# Patient Record
Sex: Female | Born: 1985 | Race: White | Hispanic: No | Marital: Married | Smoking: Never smoker
Health system: Southern US, Community
[De-identification: ages and names within clinical notes are randomized; demographics above are authoritative.]

## PROBLEM LIST (undated history)

## (undated) DIAGNOSIS — N809 Endometriosis, unspecified: Secondary | ICD-10-CM

## (undated) DIAGNOSIS — E282 Polycystic ovarian syndrome: Secondary | ICD-10-CM

## (undated) DIAGNOSIS — Z8489 Family history of other specified conditions: Secondary | ICD-10-CM

## (undated) HISTORY — PX: WISDOM TOOTH EXTRACTION: SHX21

## (undated) HISTORY — PX: DILATION AND CURETTAGE OF UTERUS: SHX78

---

## 2019-03-11 DIAGNOSIS — N926 Irregular menstruation, unspecified: Secondary | ICD-10-CM | POA: Diagnosis not present

## 2019-03-11 DIAGNOSIS — L689 Hypertrichosis, unspecified: Secondary | ICD-10-CM | POA: Diagnosis not present

## 2019-03-11 DIAGNOSIS — Z6841 Body Mass Index (BMI) 40.0 and over, adult: Secondary | ICD-10-CM | POA: Diagnosis not present

## 2019-03-25 DIAGNOSIS — R7989 Other specified abnormal findings of blood chemistry: Secondary | ICD-10-CM | POA: Diagnosis not present

## 2019-03-25 DIAGNOSIS — R9389 Abnormal findings on diagnostic imaging of other specified body structures: Secondary | ICD-10-CM | POA: Diagnosis not present

## 2019-04-19 DIAGNOSIS — R9389 Abnormal findings on diagnostic imaging of other specified body structures: Secondary | ICD-10-CM | POA: Diagnosis not present

## 2019-04-19 DIAGNOSIS — E282 Polycystic ovarian syndrome: Secondary | ICD-10-CM | POA: Diagnosis not present

## 2019-04-19 DIAGNOSIS — N949 Unspecified condition associated with female genital organs and menstrual cycle: Secondary | ICD-10-CM | POA: Diagnosis not present

## 2019-04-19 DIAGNOSIS — Z3200 Encounter for pregnancy test, result unknown: Secondary | ICD-10-CM | POA: Diagnosis not present

## 2019-05-30 DIAGNOSIS — N949 Unspecified condition associated with female genital organs and menstrual cycle: Secondary | ICD-10-CM | POA: Diagnosis not present

## 2019-07-25 DIAGNOSIS — E282 Polycystic ovarian syndrome: Secondary | ICD-10-CM | POA: Diagnosis not present

## 2019-11-08 DIAGNOSIS — Z20822 Contact with and (suspected) exposure to covid-19: Secondary | ICD-10-CM | POA: Diagnosis not present

## 2019-12-17 ENCOUNTER — Emergency Department (HOSPITAL_COMMUNITY): Payer: BC Managed Care – PPO

## 2019-12-17 ENCOUNTER — Observation Stay (HOSPITAL_COMMUNITY)
Admission: EM | Admit: 2019-12-17 | Discharge: 2019-12-19 | Disposition: A | Discharge status: Home / Self Care | Payer: BC Managed Care – PPO | Attending: Surgery | Admitting: Surgery

## 2019-12-17 ENCOUNTER — Encounter (HOSPITAL_COMMUNITY): Payer: Self-pay | Admitting: Emergency Medicine

## 2019-12-17 ENCOUNTER — Other Ambulatory Visit: Payer: Self-pay

## 2019-12-17 DIAGNOSIS — K8000 Calculus of gallbladder with acute cholecystitis without obstruction: Principal | ICD-10-CM | POA: Diagnosis present

## 2019-12-17 DIAGNOSIS — Z79899 Other long term (current) drug therapy: Secondary | ICD-10-CM | POA: Diagnosis not present

## 2019-12-17 DIAGNOSIS — R1011 Right upper quadrant pain: Secondary | ICD-10-CM

## 2019-12-17 DIAGNOSIS — K8010 Calculus of gallbladder with chronic cholecystitis without obstruction: Secondary | ICD-10-CM | POA: Diagnosis not present

## 2019-12-17 DIAGNOSIS — Z20822 Contact with and (suspected) exposure to covid-19: Secondary | ICD-10-CM | POA: Diagnosis not present

## 2019-12-17 DIAGNOSIS — K810 Acute cholecystitis: Secondary | ICD-10-CM

## 2019-12-17 HISTORY — DX: Family history of other specified conditions: Z84.89

## 2019-12-17 LAB — COMPREHENSIVE METABOLIC PANEL
ALT: 37 U/L (ref 0–44)
AST: 21 U/L (ref 15–41)
Albumin: 3.9 g/dL (ref 3.5–5.0)
Alkaline Phosphatase: 74 U/L (ref 38–126)
Anion gap: 9 (ref 5–15)
BUN: 10 mg/dL (ref 6–20)
CO2: 26 mmol/L (ref 22–32)
Calcium: 9.3 mg/dL (ref 8.9–10.3)
Chloride: 102 mmol/L (ref 98–111)
Creatinine, Ser: 0.61 mg/dL (ref 0.44–1.00)
GFR calc Af Amer: >60 mL/min (ref >60)
GFR calc non Af Amer: >60 mL/min (ref >60)
Glucose, Bld: 93 mg/dL (ref 70–99)
Potassium: 4.1 mmol/L (ref 3.5–5.1)
Sodium: 137 mmol/L (ref 135–145)
Total Bilirubin: 0.4 mg/dL (ref 0.3–1.2)
Total Protein: 7.2 g/dL (ref 6.5–8.1)

## 2019-12-17 LAB — CBC WITH DIFFERENTIAL/PLATELET
Abs Immature Granulocytes: 0.03 10*3/uL (ref 0.00–0.07)
Basophils Absolute: 0 10*3/uL (ref 0.0–0.1)
Basophils Relative: 0 %
Eosinophils Absolute: 0 10*3/uL (ref 0.0–0.5)
Eosinophils Relative: 0 %
HCT: 38.9 % (ref 36.0–46.0)
Hemoglobin: 12.4 g/dL (ref 12.0–15.0)
Immature Granulocytes: 0 %
Lymphocytes Relative: 22 %
Lymphs Abs: 1.9 10*3/uL (ref 0.7–4.0)
MCH: 27.6 pg (ref 26.0–34.0)
MCHC: 31.9 g/dL (ref 30.0–36.0)
MCV: 86.6 fL (ref 80.0–100.0)
Monocytes Absolute: 0.6 10*3/uL (ref 0.1–1.0)
Monocytes Relative: 7 %
Neutro Abs: 6.3 10*3/uL (ref 1.7–7.7)
Neutrophils Relative %: 71 %
Platelets: 266 10*3/uL (ref 150–400)
RBC: 4.49 MIL/uL (ref 3.87–5.11)
RDW: 14.4 % (ref 11.5–15.5)
WBC: 9 10*3/uL (ref 4.0–10.5)
nRBC: 0 % (ref 0.0–0.2)

## 2019-12-17 LAB — LIPASE, BLOOD: Lipase: 28 U/L (ref 11–51)

## 2019-12-17 LAB — URINALYSIS, ROUTINE W REFLEX MICROSCOPIC
Bilirubin Urine: NEGATIVE
Glucose, UA: NEGATIVE mg/dL
Hgb urine dipstick: NEGATIVE
Ketones, ur: NEGATIVE mg/dL
Nitrite: NEGATIVE
Protein, ur: NEGATIVE mg/dL
Specific Gravity, Urine: 1.02 (ref 1.005–1.030)
pH: 6 (ref 5.0–8.0)

## 2019-12-17 LAB — I-STAT BETA HCG BLOOD, ED (MC, WL, AP ONLY): I-stat hCG, quantitative: <5 m[IU]/mL (ref <5)

## 2019-12-17 LAB — SARS CORONAVIRUS 2 BY RT PCR (HOSPITAL ORDER, PERFORMED IN ~~LOC~~ HOSPITAL LAB): SARS Coronavirus 2: NEGATIVE

## 2019-12-17 MED ORDER — ENOXAPARIN SODIUM 40 MG/0.4ML ~~LOC~~ SOLN
40.0000 mg | SUBCUTANEOUS | Status: DC
  Administered 2019-12-17: 40 mg via SUBCUTANEOUS
  Filled 2019-12-17: qty 0.4

## 2019-12-17 MED ORDER — SODIUM CHLORIDE 0.9 % IV SOLN
2.0000 g | INTRAVENOUS | Status: DC
  Administered 2019-12-17: 2 g via INTRAVENOUS
  Filled 2019-12-17 (×2): qty 20

## 2019-12-17 MED ORDER — POTASSIUM CHLORIDE IN NACL 20-0.9 MEQ/L-% IV SOLN
INTRAVENOUS | Status: DC
  Filled 2019-12-17 (×2): qty 1000

## 2019-12-17 MED ORDER — ONDANSETRON 4 MG PO TBDP: 4.0000 mg | ORAL_TABLET | Freq: Four times a day (QID) | ORAL | Status: DC | PRN

## 2019-12-17 MED ORDER — ACETAMINOPHEN 325 MG PO TABS
650.0000 mg | ORAL_TABLET | Freq: Four times a day (QID) | ORAL | Status: DC | PRN
  Administered 2019-12-18: 650 mg via ORAL
  Filled 2019-12-17: qty 2

## 2019-12-17 MED ORDER — MORPHINE SULFATE (PF) 2 MG/ML IV SOLN
2.0000 mg | INTRAVENOUS | Status: DC | PRN
  Administered 2019-12-18: 2 mg via INTRAVENOUS
  Filled 2019-12-17: qty 1

## 2019-12-17 MED ORDER — DIPHENHYDRAMINE HCL 50 MG/ML IJ SOLN: 25.0000 mg | Freq: Four times a day (QID) | INTRAMUSCULAR | Status: DC | PRN

## 2019-12-17 MED ORDER — SODIUM CHLORIDE 0.9 % IV BOLUS
1000.0000 mL | Freq: Once | INTRAVENOUS | Status: AC
  Administered 2019-12-17: 1000 mL via INTRAVENOUS

## 2019-12-17 MED ORDER — DIPHENHYDRAMINE HCL 25 MG PO CAPS: 25.0000 mg | ORAL_CAPSULE | Freq: Four times a day (QID) | ORAL | Status: DC | PRN

## 2019-12-17 MED ORDER — ACETAMINOPHEN 650 MG RE SUPP: 650.0000 mg | Freq: Four times a day (QID) | RECTAL | Status: DC | PRN

## 2019-12-17 MED ORDER — ONDANSETRON HCL 4 MG/2ML IJ SOLN
4.0000 mg | Freq: Once | INTRAMUSCULAR | Status: AC
  Administered 2019-12-17: 4 mg via INTRAVENOUS
  Filled 2019-12-17: qty 2

## 2019-12-17 MED ORDER — ONDANSETRON HCL 4 MG/2ML IJ SOLN: 4.0000 mg | Freq: Four times a day (QID) | INTRAMUSCULAR | Status: DC | PRN

## 2019-12-17 NOTE — ED Triage Notes (Signed)
Patient with right flank pain that started last night around 11pm.  Patient states that it has gotten worse, making her nauseated and she has vomited this evening.  Pain radiates to her back.  Patient denies any urinary symptoms at this time.  No history of kidney stones.

## 2019-12-17 NOTE — ED Provider Notes (Signed)
[facility] EMERGENCY DEPARTMENT Provider Note   CSN: [id number] Arrival date & time: 12/17/19  0324     History Chief Complaint  Patient presents with  . Flank Pain  . Emesis    Eileen Todd is a 34 y.o. female who presents to the ED today with complaint of sudden onset, constant, sharp, RUQ/right flank pain that began last night around 09:30 PM. Pt reports she had eaten a burger and a buffalo chicken salad and approximately 4 hours later began having the pain. Pt also complains of nausea and ~ 4 episodes of NBNB emesis. She came to the ED as the pain was severe however since being in the waiting room the pain has subsided. She reports it was about a 9/10 on arrival and is currently a 1/10 without intervention. Pt is still nauseated however has not vomited in several hours; has not tried to eat anything yet. She denies any previous similar symptoms. Denies fevers, chills, diarrhea, constipation, urinary symptoms, pelvic pain, vaginal discharge, or any other associated symptoms. No previous abdominal surgeries. No hx of kidney stones.   The history is provided by the patient and medical records.       History reviewed. No pertinent past medical history.  There are no problems to display for this patient.   History reviewed. No pertinent surgical history.   OB History   No obstetric history on file.     No family history on file.  Social History   Tobacco Use  . Smoking status: Never Smoker  . Smokeless tobacco: Never Used  Substance Use Topics  . Alcohol use: Not on file  . Drug use: Not on file    Home Medications Prior to Admission medications   Medication Sig Start Date End Date Taking? Authorizing Provider  metFORMIN (GLUCOPHAGE) 500 MG tablet Take 500 mg by mouth 3 (three) times daily. 11/14/19  Yes [provider]  SLYND 4 MG TABS Take 1 tablet by mouth daily. 11/25/19  Yes [provider]    Allergies    Patient has no  known allergies.  Review of Systems   Review of Systems  Constitutional: Negative for chills and fever.  Respiratory: Negative for shortness of breath.   Cardiovascular: Negative for chest pain.  Gastrointestinal: Positive for abdominal pain, nausea and vomiting. Negative for constipation and diarrhea.  Genitourinary: Positive for flank pain. Negative for difficulty urinating, dysuria, hematuria and menstrual problem.  All other systems reviewed and are negative.   Physical Exam Updated Vital Signs BP 119/66   Pulse 84   Temp 98.2 F (36.8 C) (Oral)   Resp (!) 22   Ht 5\' 7"  (1.702 m)   Wt 124.7 kg   SpO2 98%   BMI 43.07 kg/m   Physical Exam Vitals and nursing note reviewed.  Constitutional:      Appearance: She is obese. She is not ill-appearing or diaphoretic.  HENT:     Head: Normocephalic and atraumatic.     Mouth/Throat:     Mouth: Mucous membranes are dry.  Eyes:     Conjunctiva/sclera: Conjunctivae normal.  Cardiovascular:     Rate and Rhythm: Normal rate and regular rhythm.     Pulses: Normal pulses.  Pulmonary:     Effort: Pulmonary effort is normal.     Breath sounds: Normal breath sounds. No wheezing, rhonchi or rales.  Abdominal:     Palpations: Abdomen is soft.     Tenderness: There is abdominal tenderness. There  is no right CVA tenderness, left CVA tenderness, guarding or rebound. Positive signs include Murphy's sign.  Musculoskeletal:     Cervical back: Neck supple.  Skin:    General: Skin is warm and dry.  Neurological:     Mental Status: She is alert.     ED Results / Procedures / Treatments   Labs (all labs ordered are listed, but only abnormal results are displayed) Labs Reviewed  URINALYSIS, ROUTINE W REFLEX MICROSCOPIC - Abnormal; Notable for the following components:      Result Value   APPearance HAZY (*)    Leukocytes,Ua TRACE (*)    Bacteria, UA RARE (*)    All other components within normal limits  SARS CORONAVIRUS 2 BY RT PCR  (HOSPITAL ORDER, [facility])  COMPREHENSIVE METABOLIC PANEL  LIPASE, BLOOD  CBC WITH DIFFERENTIAL/PLATELET  I-STAT BETA HCG BLOOD, ED ([facility], [facility], [facility] ONLY)    EKG None  Radiology CT Renal Stone Study  Result Date: 12/17/2019 CLINICAL DATA:  Per ed notes: Patient with right flank pain that started last night around 11pm. Patient states that it has gotten worse, making her nauseated and she has vomited this evening. Pain radiates to her backFlank pain, kidney stone suspected EXAM: CT ABDOMEN AND PELVIS WITHOUT CONTRAST TECHNIQUE: Multidetector CT imaging of the abdomen and pelvis was performed following the standard protocol without IV contrast. COMPARISON:  None. FINDINGS: Lower chest: Lung bases are clear. Hepatobiliary: No focal hepatic lesion. Low-density within the liver parenchyma. Large 2 cm gallstone without evidence of gallbladder inflammation. Pancreas: Pancreas is normal. No ductal dilatation. No pancreatic inflammation. Spleen: Normal spleen Adrenals/urinary tract: RIGHT adrenal gland normal. Round lesion adjacent to the LEFT adrenal gland measuring 1.8 cm has low density consistent with a adrenal adenoma. No nephrolithiasis or ureterolithiasis. No obstructive uropathy. No bladder calculi. Stomach/Bowel: Stomach, small-bowel and cecum are normal. The appendix is not identified but there is no pericecal inflammation to suggest appendicitis. The colon and rectosigmoid colon are normal. Vascular/Lymphatic: Abdominal aorta is normal caliber. No periportal or retroperitoneal adenopathy. No pelvic adenopathy. Reproductive: Lobular uterus suggest underlying leiomyoma. Ovaries normal. Other: No free fluid. Musculoskeletal: No aggressive osseous lesion. IMPRESSION: 1. No nephrolithiasis, ureterolithiasis or obstructive uropathy. 2. Hepatic steatosis. 3. Cholelithiasis without evidence cholecystitis. 4. LEFT adrenal adenoma. 5. leiomyomatous uterus. Electronically Signed   By:  Suzy Bouchard M.D.   On: 12/17/2019 05:43   US Abdomen Limited RUQ  Result Date: 12/17/2019 CLINICAL DATA:  Upper abdominal pain with vomiting EXAM: ULTRASOUND ABDOMEN LIMITED RIGHT UPPER QUADRANT COMPARISON:  None. FINDINGS: Gallbladder: Within the gallbladder, there is a 2.7 cm echogenic focus which moves and shadows consistent with either one large or multiple adherent smaller gallstones. The gallbladder wall is thickened and edematous with mild pericholecystic fluid. No sonographic Murphy sign noted by sonographer. Common bile duct: Diameter: 4 mm. No intrahepatic or extrahepatic biliary duct dilatation. Liver: No focal lesion identified. Liver echogenicity overall is increased. Portal vein is patent on color Doppler imaging with normal direction of blood flow towards the liver. Other: None. IMPRESSION: 1. Cholelithiasis with thickened, edematous gallbladder wall and pericholecystic fluid. These findings are indicative of a degree of acute cholecystitis. 2. Increase in liver echogenicity, a finding indicative of hepatic steatosis. No focal liver lesions evident. Electronically Signed   By: Lowella Grip III M.D.   On: 12/17/2019 12:10    Procedures Procedures (including critical care time)  Medications Ordered in ED Medications  sodium chloride 0.9 %  bolus 1,000 mL (0 mLs Intravenous Stopped 12/17/19 1100)  ondansetron (ZOFRAN) injection 4 mg (4 mg Intravenous Given 12/17/19 0957)    ED Course  I have reviewed the triage vital signs and the nursing notes.  Pertinent labs & imaging results that were available during my care of the patient were reviewed by me and considered in my medical decision making (see chart for details).  Clinical Course as of Dec 16 1417  Sat Dec 16, 1368  [id number] 34 year old female with a history of obesity present emergency department right upper quadrant and right flank pain with onset last night.  She family history of biliary disease.  Here she does have a  positive Murphy sign with significant tenderness in the right upper quadrant.  Her CT scan and subsequent ultrasound showed signs of edema or inflammation around the gallbladder.  Patient was evaluated by the general surgeon who decided to admit her to their service for suspected biliary disease.  She is otherwise well-appearing.  She reports her pain had diminished to 0 out of 10, but she does still continue have tenderness on exam.  She has no nausea currently.  Her labs and work-up were not suggestive of sepsis.  Patient updated about likely diagnosis.  Management per surgery team.   [MT]    Clinical Course User Index [MT] Wyvonnia Dusky, MD   MDM Rules/Calculators/A&P                          34 year old female who presents to the ED today with complaint of right upper quadrant/right flank pain that started last night at 9:30 PM with associated nausea and vomiting.  She presented to the ED several hours ago and sat in the waiting room.  She had a urinalysis obtained which does show trace leuks and rare bacteria however 0-5 white blood cells per high-power field, 0-5 red blood cells, no hemoglobin on dipstick.  Beta-hCG is negative.  She has no history of stones however a CT renal stone study was obtained while patient was in the waiting room, no findings of nephrolithiasis.  Incidentally CT scan does show cholelithiasis without evidence of cholecystitis.  Patient is brought back to the room she has obvious positive Murphy sign on exam.  She reports the symptoms began 4 hours after eating a burger and a buffalo chicken salad.  Suspect this is the trigger.  Will plan for lab work including CBC, CMP, lipase.  If any abnormalities will likely obtain right upper quadrant ultrasound however I have low suspicion for acute cholecystitis today given patient is overall well-appearing, reports her pain is subsided, on arrival she was afebrile, nontachycardic and nontachypneic.  She denies any fevers or chills  at home.  Will likely plan for discharge and outpatient follow-up with general surgery.   CBC without leukocytosis. Hgb stable at 12.4.  CMP without acute abnormalities. LFTs all unremarkable.  Lipase 28.   Labwork all reassuring however given pt still having TTP to RUQ will plan for ultrasound at this time for further assessment.   RUQ ultrasound IMPRESSION:  1. Cholelithiasis with thickened, edematous gallbladder wall and  pericholecystic fluid. These findings are indicative of a degree of  acute cholecystitis.    2. Increase in liver echogenicity, a finding indicative of hepatic  steatosis. No focal liver lesions evident.   Given degree of acute cholecystitis on ultrasound will plan to discuss with general surgery team and ask them to come evaluate patient  at bedside. Pt overall well appearing at this time. Last ate or drank at 5 PM last night.   Discussed case with general surgeon Dr. Georgette Dover; will come evaluate patient, admit, and plan for OR in the morning. COVID test ordered.   This note was prepared using Dragon voice recognition software and may include unintentional dictation errors due to the inherent limitations of voice recognition software.  Final Clinical Impression(s) / ED Diagnoses Final diagnoses:  RUQ abdominal pain  Acute cholecystitis    Rx / DC Orders ED Discharge Orders    None       Eustaquio Maize, PA-C 12/17/19 1419    Wyvonnia Dusky, MD 12/17/19 1719

## 2019-12-17 NOTE — H&P (Signed)
Eileen Todd is an 34 y.o. female.   Chief Complaint: RUQ pain/ nausea/ vomiting HPI: This is a 34 year old female who is prediabetic who presents with acute onset of RUQ pain last night after eating a buffalo chicken salad and a burger.  She has had no previous similar episodes.  She reports that the pain radiated around her right side to her flank.  She also several episodes of nausea and vomiting, in addition to abdominal bloating.  While waiting in the ED waiting area, the pain subsides, but she remains quite tender to palpation and with the [place].  PMH - Prediabetic - on Metformin  PSH - D&C, wisdom teeth  FH: - Malignant hyperthermia in her paternal grandfather  Social History:  reports that she has never smoked. She has never used smokeless tobacco. No history on file for alcohol use and drug use.  Allergies: No Known Allergies  Prior to Admission medications   Medication Sig Start Date End Date Taking? Authorizing Provider  metFORMIN (GLUCOPHAGE) 500 MG tablet Take 500 mg by mouth 3 (three) times daily. 11/14/19  Yes [provider]  SLYND 4 MG TABS Take 1 tablet by mouth daily. 11/25/19  Yes [provider]     Results for orders placed or performed during the hospital encounter of 12/17/19 (from the past 48 hour(s))  Urinalysis, Routine w reflex microscopic Urine, Clean Catch     Status: Abnormal   Collection Time: 12/17/19  3:35 AM  Result Value Ref Range   Color, Urine YELLOW YELLOW   APPearance HAZY (A) CLEAR   Specific Gravity, Urine 1.020 1.005 - 1.030   pH 6.0 5.0 - 8.0   Glucose, UA NEGATIVE NEGATIVE mg/dL   Hgb urine dipstick NEGATIVE NEGATIVE   Bilirubin Urine NEGATIVE NEGATIVE   Ketones, ur NEGATIVE NEGATIVE mg/dL   Protein, ur NEGATIVE NEGATIVE mg/dL   Nitrite NEGATIVE NEGATIVE   Leukocytes,Ua TRACE (A) NEGATIVE   RBC / HPF 0-5 0 - 5 RBC/hpf   WBC, UA 0-5 0 - 5 WBC/hpf   Bacteria, UA RARE (A) NONE SEEN   Squamous Epithelial / LPF 0-5 0 - 5     Comment: Performed at [facility], [address], [place], [place] [address]  I-Stat beta hCG blood, ED     Status: None   Collection Time: 12/17/19  4:06 AM  Result Value Ref Range   I-stat hCG, quantitative <5.0 <5 mIU/mL   Comment 3            Comment:   GEST. AGE      CONC.  (mIU/mL)   <=1 WEEK        5 - 50     2 WEEKS       50 - 500     3 WEEKS       100 - 10,000     4 WEEKS     1,000 - 30,000        FEMALE AND NON-PREGNANT FEMALE:     LESS THAN 5 mIU/mL   Comprehensive metabolic panel     Status: None   Collection Time: 12/17/19 10:06 AM  Result Value Ref Range   Sodium 137 135 - 145 mmol/L   Potassium 4.1 3.5 - 5.1 mmol/L   Chloride 102 98 - 111 mmol/L   CO2 26 22 - 32 mmol/L   Glucose, Bld 93 70 - 99 mg/dL    Comment: Glucose reference range applies only to samples  taken after fasting for at least 8 hours.   BUN 10 6 - 20 mg/dL   Creatinine, Ser 0.61 0.44 - 1.00 mg/dL   Calcium 9.3 8.9 - 10.3 mg/dL   Total Protein 7.2 6.5 - 8.1 g/dL   Albumin 3.9 3.5 - 5.0 g/dL   AST 21 15 - 41 U/L   ALT 37 0 - 44 U/L   Alkaline Phosphatase 74 38 - 126 U/L   Total Bilirubin 0.4 0.3 - 1.2 mg/dL   GFR calc non Af Amer >60 >60 mL/min   GFR calc Af Amer >60 >60 mL/min   Anion gap 9 5 - 15    Comment: Performed at [facility], [place] [address], [place], [place] [address]  Lipase, blood     Status: None   Collection Time: 12/17/19 10:06 AM  Result Value Ref Range   Lipase 28 11 - 51 U/L    Comment: Performed at [facility] [address] [place], [place] [address]  CBC with Differential     Status: None   Collection Time: 12/17/19 10:06 AM  Result Value Ref Range   WBC 9.0 4.0 - 10.5 K/uL   RBC 4.49 3.87 - 5.11 MIL/uL   Hemoglobin 12.4 12.0 - 15.0 g/dL   HCT 38.9 36 - 46 %   MCV 86.6 80.0 - 100.0 fL   MCH 27.6 26.0 - 34.0 pg   MCHC 31.9 30.0 - 36.0 g/dL   RDW 14.4 11.5 - 15.5 %   Platelets 266 150 - 400 K/uL   nRBC 0.0 0.0 - 0.2 %   Neutrophils Relative %  71 %   Neutro Abs 6.3 1.7 - 7.7 K/uL   Lymphocytes Relative 22 %   Lymphs Abs 1.9 0.7 - 4.0 K/uL   Monocytes Relative 7 %   Monocytes Absolute 0.6 0 - 1 K/uL   Eosinophils Relative 0 %   Eosinophils Absolute 0.0 0 - 0 K/uL   Basophils Relative 0 %   Basophils Absolute 0.0 0 - 0 K/uL   Immature Granulocytes 0 %   Abs Immature Granulocytes 0.03 0.00 - 0.07 K/uL    Comment: Performed at [facility], [address], [place], [place] [address]   CT Renal Stone Study  Result Date: 12/17/2019 CLINICAL DATA:  Per ed notes: Patient with right flank pain that started last night around 11pm. Patient states that it has gotten worse, making her nauseated and she has vomited this evening. Pain radiates to her backFlank pain, kidney stone suspected EXAM: CT ABDOMEN AND PELVIS WITHOUT CONTRAST TECHNIQUE: Multidetector CT imaging of the abdomen and pelvis was performed following the standard protocol without IV contrast. COMPARISON:  None. FINDINGS: Lower chest: Lung bases are clear. Hepatobiliary: No focal hepatic lesion. Low-density within the liver parenchyma. Large 2 cm gallstone without evidence of gallbladder inflammation. Pancreas: Pancreas is normal. No ductal dilatation. No pancreatic inflammation. Spleen: Normal spleen Adrenals/urinary tract: RIGHT adrenal gland normal. Round lesion adjacent to the LEFT adrenal gland measuring 1.8 cm has low density consistent with a adrenal adenoma. No nephrolithiasis or ureterolithiasis. No obstructive uropathy. No bladder calculi. Stomach/Bowel: Stomach, small-bowel and cecum are normal. The appendix is not identified but there is no pericecal inflammation to suggest appendicitis. The colon and rectosigmoid colon are normal. Vascular/Lymphatic: Abdominal aorta is normal caliber. No periportal or retroperitoneal adenopathy. No pelvic adenopathy. Reproductive: Lobular uterus suggest underlying leiomyoma. Ovaries normal. Other: No free fluid. Musculoskeletal: No  aggressive osseous lesion.  IMPRESSION: 1. No nephrolithiasis, ureterolithiasis or obstructive uropathy. 2. Hepatic steatosis. 3. Cholelithiasis without evidence cholecystitis. 4. LEFT adrenal adenoma. 5. leiomyomatous uterus. Electronically Signed   By: Suzy Bouchard M.D.   On: 12/17/2019 05:43   US Abdomen Limited RUQ  Result Date: 12/17/2019 CLINICAL DATA:  Upper abdominal pain with vomiting EXAM: ULTRASOUND ABDOMEN LIMITED RIGHT UPPER QUADRANT COMPARISON:  None. FINDINGS: Gallbladder: Within the gallbladder, there is a 2.7 cm echogenic focus which moves and shadows consistent with either one large or multiple adherent smaller gallstones. The gallbladder wall is thickened and edematous with mild pericholecystic fluid. No sonographic Murphy sign noted by sonographer. Common bile duct: Diameter: 4 mm. No intrahepatic or extrahepatic biliary duct dilatation. Liver: No focal lesion identified. Liver echogenicity overall is increased. Portal vein is patent on color Doppler imaging with normal direction of blood flow towards the liver. Other: None. IMPRESSION: 1. Cholelithiasis with thickened, edematous gallbladder wall and pericholecystic fluid. These findings are indicative of a degree of acute cholecystitis. 2. Increase in liver echogenicity, a finding indicative of hepatic steatosis. No focal liver lesions evident. Electronically Signed   By: Lowella Grip III M.D.   On: 12/17/2019 12:10    Review of Systems  HENT: Negative for ear discharge, ear pain, hearing loss and tinnitus.   Eyes: Negative for photophobia and pain.  Respiratory: Negative for cough and shortness of breath.   Cardiovascular: Negative for chest pain.  Gastrointestinal: Positive for abdominal pain, nausea and vomiting.  Genitourinary: Positive for flank pain. Negative for dysuria, frequency and urgency.  Musculoskeletal: Negative for back pain, myalgias and neck pain.  Neurological: Negative for dizziness and headaches.   Hematological: Does not bruise/bleed easily.  Psychiatric/Behavioral: The patient is not nervous/anxious.     Blood pressure (!) 120/59, pulse 75, temperature 98.2 F (36.8 C), temperature source Oral, resp. rate (!) 21, height 5\' 7"  (1.702 m), weight 124.7 kg, SpO2 96 %. Physical Exam  Constitutional:  WDWN in NAD, conversant, no obvious deformities; lying in bed comfortably Eyes:  Pupils equal, round; sclera anicteric; moist conjunctiva; no lid lag HENT:  Oral mucosa moist; good dentition  Neck:  No masses palpated, trachea midline; no thyromegaly Lungs:  CTA bilaterally; normal respiratory effort CV:  Regular rate and rhythm; no murmurs; extremities well-perfused with no edema Abd:  +bowel sounds, obese, tender in RUQ Musc:  Unable to assess gait; no apparent clubbing or cyanosis in extremities Lymphatic:  No palpable cervical or axillary lymphadenopathy Skin:  Warm, dry; no sign of jaundice Psychiatric - alert and oriented x 4; calm mood and affect  Assessment/Plan Acute calculus cholecystitis  Admit for IV antibiotics Plan laparoscopic cholecystectomy by Dr. Kieth Brightly tomorrow.  The surgical procedure has been discussed with the patient.  Potential risks, benefits, alternative treatments, and expected outcomes have been explained.  All of the patient's questions at this time have been answered.  The likelihood of reaching the patient's treatment goal is good.  The patient understand the proposed surgical procedure and wishes to proceed.   Maia Petties, MD 12/17/2019, 1:28 PM

## 2019-12-18 ENCOUNTER — Encounter (HOSPITAL_COMMUNITY): Payer: Self-pay

## 2019-12-18 ENCOUNTER — Observation Stay (HOSPITAL_COMMUNITY): Payer: BC Managed Care – PPO | Admitting: Certified Registered Nurse Anesthetist

## 2019-12-18 ENCOUNTER — Encounter (HOSPITAL_COMMUNITY)
Admission: EM | Disposition: A | Discharge status: Home / Self Care | Payer: Self-pay | Source: Home / Self Care | Attending: Emergency Medicine

## 2019-12-18 DIAGNOSIS — K8000 Calculus of gallbladder with acute cholecystitis without obstruction: Secondary | ICD-10-CM | POA: Diagnosis not present

## 2019-12-18 DIAGNOSIS — K8010 Calculus of gallbladder with chronic cholecystitis without obstruction: Secondary | ICD-10-CM | POA: Diagnosis not present

## 2019-12-18 DIAGNOSIS — E119 Type 2 diabetes mellitus without complications: Secondary | ICD-10-CM | POA: Diagnosis not present

## 2019-12-18 HISTORY — PX: CHOLECYSTECTOMY: SHX55

## 2019-12-18 LAB — GLUCOSE, CAPILLARY: Glucose-Capillary: 89 mg/dL (ref 70–99)

## 2019-12-18 LAB — HIV ANTIBODY (ROUTINE TESTING W REFLEX): HIV Screen 4th Generation wRfx: NONREACTIVE

## 2019-12-18 LAB — SURGICAL PCR SCREEN
MRSA, PCR: NEGATIVE
Staphylococcus aureus: NEGATIVE

## 2019-12-18 SURGERY — LAPAROSCOPIC CHOLECYSTECTOMY WITH INTRAOPERATIVE CHOLANGIOGRAM
Anesthesia: General | Site: Abdomen

## 2019-12-18 MED ORDER — BUPIVACAINE HCL (PF) 0.25 % IJ SOLN
INTRAMUSCULAR | Status: AC
  Filled 2019-12-18: qty 30

## 2019-12-18 MED ORDER — CHLORHEXIDINE GLUCONATE 0.12 % MT SOLN
OROMUCOSAL | Status: AC
  Administered 2019-12-18: 15 mL
  Filled 2019-12-18: qty 15

## 2019-12-18 MED ORDER — OXYCODONE HCL 5 MG PO TABS
5.0000 mg | ORAL_TABLET | Freq: Four times a day (QID) | ORAL | Status: DC | PRN
  Filled 2019-12-18: qty 1

## 2019-12-18 MED ORDER — FENTANYL CITRATE (PF) 100 MCG/2ML IJ SOLN
25.0000 ug | INTRAMUSCULAR | Status: DC | PRN
  Administered 2019-12-18: 50 ug via INTRAVENOUS

## 2019-12-18 MED ORDER — BUPIVACAINE HCL 0.25 % IJ SOLN
INTRAMUSCULAR | Status: DC | PRN
  Administered 2019-12-18: 26 mL

## 2019-12-18 MED ORDER — ENSURE PRE-SURGERY PO LIQD
296.0000 mL | Freq: Once | ORAL | Status: AC
  Administered 2019-12-18: 296 mL via ORAL
  Filled 2019-12-18: qty 296

## 2019-12-18 MED ORDER — FENTANYL CITRATE (PF) 250 MCG/5ML IJ SOLN
INTRAMUSCULAR | Status: AC
  Filled 2019-12-18: qty 5

## 2019-12-18 MED ORDER — CEFAZOLIN SODIUM-DEXTROSE 2-4 GM/100ML-% IV SOLN
INTRAVENOUS | Status: AC
  Filled 2019-12-18: qty 100

## 2019-12-18 MED ORDER — ROCURONIUM BROMIDE 10 MG/ML (PF) SYRINGE
PREFILLED_SYRINGE | INTRAVENOUS | Status: DC | PRN
  Administered 2019-12-18: 50 mg via INTRAVENOUS
  Administered 2019-12-18 (×2): 20 mg via INTRAVENOUS

## 2019-12-18 MED ORDER — GABAPENTIN 300 MG PO CAPS
300.0000 mg | ORAL_CAPSULE | ORAL | Status: AC
  Administered 2019-12-18: 300 mg via ORAL
  Filled 2019-12-18: qty 1

## 2019-12-18 MED ORDER — SODIUM CHLORIDE 0.9 % IR SOLN
Status: DC | PRN
  Administered 2019-12-18: 1000 mL

## 2019-12-18 MED ORDER — PROPOFOL 10 MG/ML IV BOLUS
INTRAVENOUS | Status: AC
  Filled 2019-12-18: qty 20

## 2019-12-18 MED ORDER — KETOROLAC TROMETHAMINE 15 MG/ML IJ SOLN
15.0000 mg | Freq: Three times a day (TID) | INTRAMUSCULAR | Status: DC
  Administered 2019-12-18 – 2019-12-19 (×2): 15 mg via INTRAVENOUS
  Filled 2019-12-18 (×2): qty 1

## 2019-12-18 MED ORDER — GABAPENTIN 300 MG PO CAPS
ORAL_CAPSULE | ORAL | Status: AC
  Filled 2019-12-18: qty 1

## 2019-12-18 MED ORDER — DEXAMETHASONE SODIUM PHOSPHATE 10 MG/ML IJ SOLN
INTRAMUSCULAR | Status: DC | PRN
  Administered 2019-12-18: 10 mg via INTRAVENOUS

## 2019-12-18 MED ORDER — 0.9 % SODIUM CHLORIDE (POUR BTL) OPTIME
TOPICAL | Status: DC | PRN
  Administered 2019-12-18: 1000 mL

## 2019-12-18 MED ORDER — LIDOCAINE 2% (20 MG/ML) 5 ML SYRINGE
INTRAMUSCULAR | Status: DC | PRN
  Administered 2019-12-18: 60 mg via INTRAVENOUS

## 2019-12-18 MED ORDER — WHITE PETROLATUM EX OINT
TOPICAL_OINTMENT | CUTANEOUS | Status: AC
  Filled 2019-12-18: qty 28.35

## 2019-12-18 MED ORDER — PROPOFOL 10 MG/ML IV BOLUS
INTRAVENOUS | Status: DC | PRN
  Administered 2019-12-18: 120 mg via INTRAVENOUS

## 2019-12-18 MED ORDER — OXYCODONE HCL 5 MG PO TABS: 5.0000 mg | ORAL_TABLET | Freq: Four times a day (QID) | ORAL | 0 refills | Status: DC | PRN

## 2019-12-18 MED ORDER — PROPOFOL 500 MG/50ML IV EMUL
INTRAVENOUS | Status: DC | PRN
  Administered 2019-12-18: 150 ug/kg/min via INTRAVENOUS
  Administered 2019-12-18: 100 ug/kg/min via INTRAVENOUS
  Administered 2019-12-18: 150 ug/kg/min via INTRAVENOUS

## 2019-12-18 MED ORDER — FENTANYL CITRATE (PF) 100 MCG/2ML IJ SOLN
INTRAMUSCULAR | Status: DC | PRN
Start: 2019-12-18 — End: 2019-12-18
  Administered 2019-12-18 (×3): 50 ug via INTRAVENOUS
  Administered 2019-12-18: 100 ug via INTRAVENOUS

## 2019-12-18 MED ORDER — ACETAMINOPHEN 500 MG PO TABS
1000.0000 mg | ORAL_TABLET | ORAL | Status: AC
  Administered 2019-12-18: 1000 mg via ORAL
  Filled 2019-12-18: qty 2

## 2019-12-18 MED ORDER — AMISULPRIDE (ANTIEMETIC) 5 MG/2ML IV SOLN: 10.0000 mg | Freq: Once | INTRAVENOUS | Status: DC | PRN

## 2019-12-18 MED ORDER — IBUPROFEN 800 MG PO TABS: 800.0000 mg | ORAL_TABLET | Freq: Three times a day (TID) | ORAL | 0 refills | Status: DC | PRN

## 2019-12-18 MED ORDER — DROSPIRENONE 4 MG PO TABS: 1.0000 | ORAL_TABLET | Freq: Every day | ORAL | Status: DC

## 2019-12-18 MED ORDER — FENTANYL CITRATE (PF) 100 MCG/2ML IJ SOLN
INTRAMUSCULAR | Status: AC
Start: 2019-12-18 — End: 2019-12-19
  Filled 2019-12-18: qty 2

## 2019-12-18 MED ORDER — LACTATED RINGERS IV SOLN: INTRAVENOUS | Status: DC

## 2019-12-18 MED ORDER — ACETAMINOPHEN 500 MG PO TABS
ORAL_TABLET | ORAL | Status: AC
  Administered 2019-12-18: 1000 mg
  Filled 2019-12-18: qty 2

## 2019-12-18 MED ORDER — ONDANSETRON HCL 4 MG/2ML IJ SOLN
INTRAMUSCULAR | Status: DC | PRN
  Administered 2019-12-18: 4 mg via INTRAVENOUS

## 2019-12-18 MED ORDER — MIDAZOLAM HCL 5 MG/5ML IJ SOLN
INTRAMUSCULAR | Status: DC | PRN
  Administered 2019-12-18: 2 mg via INTRAVENOUS

## 2019-12-18 MED ORDER — SUGAMMADEX SODIUM 200 MG/2ML IV SOLN
INTRAVENOUS | Status: DC | PRN
  Administered 2019-12-18: 400 mg via INTRAVENOUS

## 2019-12-18 MED ORDER — MIDAZOLAM HCL 2 MG/2ML IJ SOLN
INTRAMUSCULAR | Status: AC
  Filled 2019-12-18: qty 2

## 2019-12-18 SURGICAL SUPPLY — 41 items
APPLIER CLIP ROT 10 11.4 M/L (STAPLE) ×3
BLADE CLIPPER SURG (BLADE) ×3 IMPLANT
CANISTER SUCT 3000ML PPV (MISCELLANEOUS) ×3 IMPLANT
CATH CHOLANG 76X19 KUMAR (CATHETERS) IMPLANT
CHLORAPREP W/TINT 26 (MISCELLANEOUS) ×3 IMPLANT
CLIP APPLIE ROT 10 11.4 M/L (STAPLE) ×1 IMPLANT
CLIP VESOLOCK MED LG 6/CT (CLIP) IMPLANT
COVER MAYO STAND STRL (DRAPES) IMPLANT
COVER SURGICAL LIGHT HANDLE (MISCELLANEOUS) ×3 IMPLANT
COVER WAND RF STERILE (DRAPES) ×3 IMPLANT
DERMABOND ADVANCED (GAUZE/BANDAGES/DRESSINGS) ×2
DERMABOND ADVANCED .7 DNX12 (GAUZE/BANDAGES/DRESSINGS) ×1 IMPLANT
DRAPE C-ARM 42X120 X-RAY (DRAPES) IMPLANT
ELECT REM PT RETURN 9FT ADLT (ELECTROSURGICAL) ×3
ELECTRODE REM PT RTRN 9FT ADLT (ELECTROSURGICAL) ×1 IMPLANT
GLOVE BIOGEL PI IND STRL 7.0 (GLOVE) ×1 IMPLANT
GLOVE BIOGEL PI INDICATOR 7.0 (GLOVE) ×2
GLOVE SURG SS PI 7.0 STRL IVOR (GLOVE) ×3 IMPLANT
GOWN STRL REUS W/ TWL LRG LVL3 (GOWN DISPOSABLE) ×3 IMPLANT
GOWN STRL REUS W/TWL LRG LVL3 (GOWN DISPOSABLE) ×6
GRASPER SUT TROCAR 14GX15 (MISCELLANEOUS) ×3 IMPLANT
KIT BASIN OR (CUSTOM PROCEDURE TRAY) ×3 IMPLANT
KIT TURNOVER KIT B (KITS) ×3 IMPLANT
NEEDLE 22X1 1/2 (OR ONLY) (NEEDLE) ×3 IMPLANT
NS IRRIG 1000ML POUR BTL (IV SOLUTION) ×3 IMPLANT
PAD ARMBOARD 7.5X6 YLW CONV (MISCELLANEOUS) ×3 IMPLANT
POUCH RETRIEVAL ECOSAC 10 (ENDOMECHANICALS) ×1 IMPLANT
POUCH RETRIEVAL ECOSAC 10MM (ENDOMECHANICALS) ×2
SCISSORS LAP 5X35 DISP (ENDOMECHANICALS) ×3 IMPLANT
SET IRRIG TUBING LAPAROSCOPIC (IRRIGATION / IRRIGATOR) ×3 IMPLANT
SET TUBE SMOKE EVAC HIGH FLOW (TUBING) ×3 IMPLANT
SLEEVE ENDOPATH XCEL 5M (ENDOMECHANICALS) ×6 IMPLANT
SPECIMEN JAR SMALL (MISCELLANEOUS) ×3 IMPLANT
STOPCOCK 4 WAY LG BORE MALE ST (IV SETS) IMPLANT
SUT MNCRL AB 4-0 PS2 18 (SUTURE) ×3 IMPLANT
TOWEL GREEN STERILE (TOWEL DISPOSABLE) ×3 IMPLANT
TOWEL GREEN STERILE FF (TOWEL DISPOSABLE) ×3 IMPLANT
TRAY LAPAROSCOPIC MC (CUSTOM PROCEDURE TRAY) ×3 IMPLANT
TROCAR XCEL 12X100 BLDLESS (ENDOMECHANICALS) ×3 IMPLANT
TROCAR XCEL NON-BLD 5MMX100MML (ENDOMECHANICALS) ×3 IMPLANT
WATER STERILE IRR 1000ML POUR (IV SOLUTION) ×3 IMPLANT

## 2019-12-18 NOTE — Discharge Instructions (Signed)
[facility] LAPAROSCOPIC SURGERY: POST OP INSTRUCTIONS Always review your discharge instruction sheet given to you by the facility where your surgery was performed. IF YOU HAVE DISABILITY OR FAMILY LEAVE FORMS, YOU MUST BRING THEM TO THE OFFICE FOR PROCESSING.   DO NOT GIVE THEM TO YOUR DOCTOR.  PAIN CONTROL  1. First take acetaminophen (Tylenol) AND/or ibuprofen (Advil) to control your pain after surgery.  Follow directions on package.  Taking acetaminophen (Tylenol) and/or ibuprofen (Advil) regularly after surgery will help to control your pain and lower the amount of prescription pain medication you may need.  You should not take more than 3,000 mg (3 grams) of acetaminophen (Tylenol) in 24 hours.  You should not take ibuprofen (Advil), aleve, motrin, naprosyn or other NSAIDS if you have a history of stomach ulcers or chronic kidney disease.  2. A prescription for pain medication may be given to you upon discharge.  Take your pain medication as prescribed, if you still have uncontrolled pain after taking acetaminophen (Tylenol) or ibuprofen (Advil). 3. Use ice packs to help control pain. 4. If you need a refill on your pain medication, please contact your pharmacy.  They will contact our office to request authorization. Prescriptions will not be filled after 5pm or on week-ends.  HOME MEDICATIONS 5. Take your usually prescribed medications unless otherwise directed.  DIET 6. You should follow a light diet the first few days after arrival home.  Be sure to include lots of fluids daily. Avoid fatty, fried foods.   CONSTIPATION 7. It is common to experience some constipation after surgery and if you are taking pain medication.  Increasing fluid intake and taking a stool softener (such as Colace) will usually help or prevent this problem from occurring.  A mild laxative (Milk of Magnesia or Miralax) should be taken according to package instructions if there are no bowel  movements after 48 hours.  WOUND/INCISION CARE 8. Most patients will experience some swelling and bruising in the area of the incisions.  Ice packs will help.  Swelling and bruising can take several days to resolve.  9. Unless discharge instructions indicate otherwise, follow guidelines below  a. STERI-STRIPS - you may remove your outer bandages 48 hours after surgery, and you may shower at that time.  You have steri-strips (small skin tapes) in place directly over the incision.  These strips should be left on the skin for 7-10 days.   b. DERMABOND/SKIN GLUE - you may shower in 24 hours.  The glue will flake off over the next 2-3 weeks. 10. Any sutures or staples will be removed at the office during your follow-up visit.  ACTIVITIES 11. You may resume regular (light) daily activities beginning the next day--such as daily self-care, walking, climbing stairs--gradually increasing activities as tolerated.  You may have sexual intercourse when it is comfortable.  Refrain from any heavy lifting or straining until approved by your doctor. a. You may drive when you are no longer taking prescription pain medication, you can comfortably wear a seatbelt, and you can safely maneuver your car and apply brakes.  FOLLOW-UP 12. You should see your doctor in the office for a follow-up appointment approximately 2-3 weeks after your surgery.  You should have been given your post-op/follow-up appointment when your surgery was scheduled.  If you did not receive a post-op/follow-up appointment, make sure that you call for this appointment within a day or two after you arrive home to insure a convenient appointment time.     WHEN TO CALL YOUR DOCTOR: 1. Fever over 101.0 2. Inability to urinate 3. Continued bleeding from incision. 4. Increased pain, redness, or drainage from the incision. 5. Increasing abdominal pain  The clinic staff is available to answer your questions during regular business hours.  Please don't  hesitate to call and ask to speak to one of the nurses for clinical concerns.  If you have a medical emergency, go to the nearest emergency room or call 911.  A surgeon from [facility] is always on call at the hospital. [address], [address], [place], [place]  [address] ? [address] [address], , [place]   [address] [phone] ? [phone] ? FAX [phone] Web site: [email or web address]  .........   Managing Your Pain After Surgery Without Opioids    Thank you for participating in our program to help patients manage their pain after surgery without opioids. This is part of our effort to provide you with the best care possible, without exposing you or your family to the risk that opioids pose.  What pain can I expect after surgery? You can expect to have some pain after surgery. This is normal. The pain is typically worse the day after surgery, and quickly begins to get better. Many studies have found that many patients are able to manage their pain after surgery with Over-the-Counter (OTC) medications such as Tylenol and Motrin. If you have a condition that does not allow you to take Tylenol or Motrin, notify your surgical team.  How will I manage my pain? The best strategy for controlling your pain after surgery is around the clock pain control with Tylenol (acetaminophen) and Motrin (ibuprofen or Advil). Alternating these medications with each other allows you to maximize your pain control. In addition to Tylenol and Motrin, you can use heating pads or ice packs on your incisions to help reduce your pain.  How will I alternate your regular strength over-the-counter pain medication? You will take a dose of pain medication every three hours. ; Start by taking 650 mg of Tylenol (2 pills of 325 mg) ; 3 hours later take 600 mg of Motrin (3 pills of 200 mg) ; 3 hours after taking the Motrin take 650 mg of Tylenol ; 3 hours after that take 600 mg of  Motrin.   - 1 -  See example - if your first dose of Tylenol is at 12:00 PM   12:00 PM Tylenol 650 mg (2 pills of 325 mg)  3:00 PM Motrin 600 mg (3 pills of 200 mg)  6:00 PM Tylenol 650 mg (2 pills of 325 mg)  9:00 PM Motrin 600 mg (3 pills of 200 mg)  Continue alternating every 3 hours   We recommend that you follow this schedule around-the-clock for at least 3 days after surgery, or until you feel that it is no longer needed. Use the table on the last page of this handout to keep track of the medications you are taking. Important: Do not take more than 3000mg of Tylenol or 3200mg of Motrin in a 24-hour period. Do not take ibuprofen/Motrin if you have a history of bleeding stomach ulcers, severe kidney disease, &/or actively taking a blood thinner  What if I still have pain? If you have pain that is not controlled with the over-the-counter pain medications (Tylenol and Motrin or Advil) you might have what we call "breakthrough" pain. You will receive a prescription for a small amount of an opioid pain medication such as   Oxycodone, Tramadol, or Tylenol with Codeine. Use these opioid pills in the first 24 hours after surgery if you have breakthrough pain. Do not take more than 1 pill every 4-6 hours.  If you still have uncontrolled pain after using all opioid pills, don't hesitate to call our staff using the number provided. We will help make sure you are managing your pain in the best way possible, and if necessary, we can provide a prescription for additional pain medication.   Day 1    Time  Name of Medication Number of pills taken  Amount of Acetaminophen  Pain Level   Comments  AM PM       AM PM       AM PM       AM PM       AM PM       AM PM       AM PM       AM PM       Total Daily amount of Acetaminophen Do not take more than  3,000 mg per day      Day 2    Time  Name of Medication Number of pills taken  Amount of Acetaminophen  Pain Level   Comments  AM  PM       AM PM       AM PM       AM PM       AM PM       AM PM       AM PM       AM PM       Total Daily amount of Acetaminophen Do not take more than  3,000 mg per day      Day 3    Time  Name of Medication Number of pills taken  Amount of Acetaminophen  Pain Level   Comments  AM PM       AM PM       AM PM       AM PM          AM PM       AM PM       AM PM       AM PM       Total Daily amount of Acetaminophen Do not take more than  3,000 mg per day      Day 4    Time  Name of Medication Number of pills taken  Amount of Acetaminophen  Pain Level   Comments  AM PM       AM PM       AM PM       AM PM       AM PM       AM PM       AM PM       AM PM       Total Daily amount of Acetaminophen Do not take more than  3,000 mg per day      Day 5    Time  Name of Medication Number of pills taken  Amount of Acetaminophen  Pain Level   Comments  AM PM       AM PM       AM PM       AM PM       AM PM       AM PM       AM PM         AM PM       Total Daily amount of Acetaminophen Do not take more than  3,000 mg per day       Day 6    Time  Name of Medication Number of pills taken  Amount of Acetaminophen  Pain Level  Comments  AM PM       AM PM       AM PM       AM PM       AM PM       AM PM       AM PM       AM PM       Total Daily amount of Acetaminophen Do not take more than  3,000 mg per day      Day 7    Time  Name of Medication Number of pills taken  Amount of Acetaminophen  Pain Level   Comments  AM PM       AM PM       AM PM       AM PM       AM PM       AM PM       AM PM       AM PM       Total Daily amount of Acetaminophen Do not take more than  3,000 mg per day        For additional information about how and where to safely dispose of unused opioid medications - https://www.morepowerfulnc.org  Disclaimer: This document contains information and/or instructional materials adapted from [facility]  for the typical patient with your condition. It does not replace medical advice from your health care provider because your experience may differ from that of the typical patient. Talk to your health care provider if you have any questions about this document, your condition or your treatment plan. Adapted from [facility]  

## 2019-12-18 NOTE — Anesthesia Procedure Notes (Signed)

## 2019-12-18 NOTE — Transfer of Care (Signed)
Immediate Anesthesia Transfer of Care Note  Patient: Eileen Todd  Procedure(s) Performed: LAPAROSCOPIC CHOLECYSTECTOMY (N/A Abdomen)  Patient Location: PACU  Anesthesia Type:General  Level of Consciousness: awake, alert  and oriented  Airway & Oxygen Therapy: Patient Spontanous Breathing and Patient connected to nasal cannula oxygen  Post-op Assessment: Report given to RN and Post -op Vital signs reviewed and stable  Post vital signs: Reviewed and stable  Last Vitals:  Vitals Value Taken Time  BP 118/71   Temp 97.3   Pulse 84 12/18/19 1311  Resp 13 12/18/19 1311  SpO2 94 % 12/18/19 1311  Vitals shown include unvalidated device data.  Last Pain:  Vitals:   12/18/19 0429  TempSrc: Oral  PainSc:          Complications: No complications documented.

## 2019-12-18 NOTE — Progress Notes (Signed)
Pre Procedure note for inpatients:   Eileen Todd has been scheduled for Procedure(s): LAPAROSCOPIC CHOLECYSTECTOMY WITH POSSIBLE INTRAOPERATIVE CHOLANGIOGRAM (N/A) today. The various methods of treatment have been discussed with the patient. After consideration of the risks, benefits and treatment options the patient has consented to the planned procedure.  We discussed the etiology of her pain, we discussed treatment options and recommended surgery. We discussed details of surgery including general anesthesia, laparoscopic approach, identification of cystic duct and common bile duct. Ligation of cystic duct and cystic artery. Possible need for intraoperative cholangiogram or open procedure. Possible risks of common bile duct injury, liver injury, cystic duct leak, bleeding, infection, post-cholecystectomy syndrome. The patient showed good understanding and all questions were answered   The patient has been seen and labs reviewed. There are no changes in the patient's condition to prevent proceeding with the planned procedure today.  Recent labs:  Lab Results  Component Value Date   WBC 9.0 12/17/2019   HGB 12.4 12/17/2019   HCT 38.9 12/17/2019   PLT 266 12/17/2019   GLUCOSE 93 12/17/2019   ALT 37 12/17/2019   AST 21 12/17/2019   NA 137 12/17/2019   K 4.1 12/17/2019   CL 102 12/17/2019   CREATININE 0.61 12/17/2019   BUN 10 12/17/2019   CO2 26 12/17/2019    Mickeal Skinner, MD 12/18/2019 9:43 AM

## 2019-12-18 NOTE — Anesthesia Preprocedure Evaluation (Addendum)
Anesthesia Evaluation  Patient identified by MRN, date of birth, ID band Patient awake    Reviewed: Allergy & Precautions, NPO status , Patient's Chart, lab work & pertinent test results  History of Anesthesia Complications (+) MALIGNANT HYPERTHERMIA and Family history of anesthesia reaction  Airway Mallampati: II  TM Distance: >3 FB Neck ROM: Full    Dental  (+) Dental Advisory Given   Pulmonary neg pulmonary ROS,    breath sounds clear to auscultation       Cardiovascular negative cardio ROS   Rhythm:Regular Rate:Normal     Neuro/Psych negative neurological ROS     GI/Hepatic negative GI ROS, Neg liver ROS,   Endo/Other  diabetesMorbid obesity  Renal/GU negative Renal ROS     Musculoskeletal   Abdominal   Peds  Hematology negative hematology ROS (+)   Anesthesia Other Findings   Reproductive/Obstetrics                            Lab Results  Component Value Date   WBC 9.0 12/17/2019   HGB 12.4 12/17/2019   HCT 38.9 12/17/2019   MCV 86.6 12/17/2019   PLT 266 12/17/2019   Lab Results  Component Value Date   CREATININE 0.61 12/17/2019   BUN 10 12/17/2019   NA 137 12/17/2019   K 4.1 12/17/2019   CL 102 12/17/2019   CO2 26 12/17/2019    Anesthesia Physical Anesthesia Plan  ASA: III  Anesthesia Plan: General   Post-op Pain Management:    Induction: Intravenous  PONV Risk Score and Plan: 3 and Midazolam, Dexamethasone, Ondansetron and Treatment may vary due to age or medical condition  Airway Management Planned: Oral ETT  Additional Equipment: None  Intra-op Plan:   Post-operative Plan: Extubation in OR  Informed Consent: I have reviewed the patients History and Physical, chart, labs and discussed the procedure including the risks, benefits and alternatives for the proposed anesthesia with the patient or authorized representative who has indicated his/her  understanding and acceptance.     Dental advisory given  Plan Discussed with: CRNA  Anesthesia Plan Comments:         Anesthesia Quick Evaluation

## 2019-12-18 NOTE — Op Note (Signed)
PATIENT:  Eileen Todd  34 y.o. female  PRE-OPERATIVE DIAGNOSIS:  acute cholecystitis  POST-OPERATIVE DIAGNOSIS:  acute cholecystitis  PROCEDURE:  Procedure(s): LAPAROSCOPIC CHOLECYSTECTOMY  SURGEON:  Surgeon(s): Kinsinger, Arta Bruce, MD  ASSISTANT: Daiva Huge, MD (Resident, PGY-7)  ANESTHESIA:   local and Todd  Description of procedure: The patient was brought into the operative suite, placed supine. Anesthesia was administered with endotracheal tube. Patient was strapped in place and foot board was secured. All pressure points were offloaded by foam padding. The patient was prepped and draped in the usual sterile fashion.  A small incision was made to the right of the umbilicus. A 59mm trocar was inserted into the peritoneal cavity with optical entry. Pneumoperitoneum was applied with high flow low pressure. 2 47mm trocars were placed in the RUQ. A 23mm trocar was placed in the subxiphoid space. Exparel:Marcaine mix was infused to the subxiphoid space and lateral upper right abdomen in the transversus abdominis plane. Next the patient was placed in reverse trendelenberg. There were dense adhesions to the omentum, which were carefully taken down using cautery. The gallbladder was inflamed and thickened.   The gallbladder was retracted cephalad and lateral. The peritoneum was reflected off the infundibulum working lateral to medial. The cystic duct and cystic artery were identified and further dissection revealed a critical view. The cystic duct and cystic artery were doubly clipped and ligated.   The gallbladder was removed off the liver bed with cautery. The gallbladder was placed in a specimen bag. The gallbladder fossa was irrigated and hemostasis was applied with cautery. The gallbladder was removed via the 72mm trocar. The fascial defect was closed with interrupted 0 vicryl suture via laparoscopic trans-fascial suture passer. Pneumoperitoneum was removed, all trocar were removed.  All incisions were closed with 4-0 monocryl subcuticular stitch. The patient woke from anesthesia and was brought to PACU in stable condition. All counts were correct  Findings: Inflamed gallbladder  Specimen: gallbladder  Blood loss: Minimal  Complications: None  PLAN OF CARE: Back to floor   PATIENT DISPOSITION:  PACU - hemodynamically stable.

## 2019-12-19 ENCOUNTER — Encounter (HOSPITAL_COMMUNITY): Payer: Self-pay | Admitting: General Surgery

## 2019-12-19 NOTE — Discharge Summary (Signed)
[facility] Discharge Summary   Patient ID: Eileen Todd MRN: [id number] DOB/AGE: [birth year] 34 y.o.  Admit date: 12/17/2019 Discharge date: 12/19/2019  Admitting Diagnosis: Acute cholecystitis   Discharge Diagnosis Patient Active Problem List   Diagnosis Date Noted  . Acute cholecystitis due to biliary calculus 12/17/2019    Consultants None   Imaging: US Abdomen Limited RUQ  Result Date: 12/17/2019 CLINICAL DATA:  Upper abdominal pain with vomiting EXAM: ULTRASOUND ABDOMEN LIMITED RIGHT UPPER QUADRANT COMPARISON:  None. FINDINGS: Gallbladder: Within the gallbladder, there is a 2.7 cm echogenic focus which moves and shadows consistent with either one large or multiple adherent smaller gallstones. The gallbladder wall is thickened and edematous with mild pericholecystic fluid. No sonographic Murphy sign noted by sonographer. Common bile duct: Diameter: 4 mm. No intrahepatic or extrahepatic biliary duct dilatation. Liver: No focal lesion identified. Liver echogenicity overall is increased. Portal vein is patent on color Doppler imaging with normal direction of blood flow towards the liver. Other: None. IMPRESSION: 1. Cholelithiasis with thickened, edematous gallbladder wall and pericholecystic fluid. These findings are indicative of a degree of acute cholecystitis. 2. Increase in liver echogenicity, a finding indicative of hepatic steatosis. No focal liver lesions evident. Electronically Signed   By: Lowella Grip III M.D.   On: 12/17/2019 12:10    Procedures Dr. Kieth Brightly (12/18/19) - Laparoscopic Cholecystectomy   Hospital Course:  Patient is a 34 year old female who presented to [facility] with abdominal pain.  Workup showed acute cholecystitis.  Patient was admitted and underwent procedure listed above.  Tolerated procedure well and was transferred to the floor.  Diet was advanced as tolerated.  On POD#1, the patient was voiding well, tolerating diet, ambulating well, pain  well controlled, vital signs stable, incisions c/d/i and felt stable for discharge home.  Patient will follow up in our office in 3 weeks and knows to call with questions or concerns.  She will call to confirm appointment date/time.    Physical Exam: General:  Alert, NAD, pleasant, comfortable Abd:  Soft, ND, mild tenderness, incisions C/D/I  I or a member of my team have reviewed this patient in the Controlled Substance Database.   Allergies as of 12/19/2019   No Known Allergies     Medication List    TAKE these medications   ibuprofen 800 MG tablet Commonly known as: ADVIL Take 1 tablet (800 mg total) by mouth every 8 (eight) hours as needed.   metFORMIN 500 MG tablet Commonly known as: GLUCOPHAGE Take 500 mg by mouth 3 (three) times daily.   oxyCODONE 5 MG immediate release tablet Commonly known as: Oxy IR/ROXICODONE Take 1 tablet (5 mg total) by mouth every 6 (six) hours as needed for severe pain.   Slynd 4 MG Tabs Generic drug: Drospirenone Take 1 tablet by mouth daily.            Discharge Care Instructions  (From admission, onward)         Start     Ordered   12/18/19 0000  Discharge wound care:       Comments: Ok to shower tomorrow. Glue will likely peel off in 1-3 weeks. No bandage required   12/18/19 1251            Follow-up Information    [facility].   Specialty: General Surgery Why: Our office should be scheduling a follow up appointment in abbout 3 weeks. If you have not heard from them by the end  of this week, please call to confirm.  Contact information: [address] [place] [address] [phone]               Signed: Norm Parcel , [facility] 12/19/2019, 7:14 AM Please see Amion for pager number during day hours 7:00am-4:30pm

## 2019-12-19 NOTE — Anesthesia Postprocedure Evaluation (Signed)
Anesthesia Post Note  Patient: Eileen Todd  Procedure(s) Performed: LAPAROSCOPIC CHOLECYSTECTOMY (N/A Abdomen)     Patient location during evaluation: PACU Anesthesia Type: General Level of consciousness: awake and alert Pain management: pain level controlled Vital Signs Assessment: post-procedure vital signs reviewed and stable Respiratory status: spontaneous breathing, nonlabored ventilation, respiratory function stable and patient connected to nasal cannula oxygen Cardiovascular status: blood pressure returned to baseline and stable Postop Assessment: no apparent nausea or vomiting Anesthetic complications: no   No complications documented.  Last Vitals:  Vitals:   12/18/19 1401 12/18/19 2025  BP: 95/83 (!) 135/103  Pulse: 78 (!) 104  Resp: 14 18  Temp: (!) 36.4 C 37.2 C  SpO2: 98% 97%    Last Pain:  Vitals:   12/18/19 2025  TempSrc: Oral  PainSc:                  Tiajuana Amass

## 2019-12-19 NOTE — Discharge Planning (Signed)
Patient discharged home in stable condition. Verbalizes understanding of all discharge instructions, including home medications and follow up appointments. 

## 2019-12-20 LAB — SURGICAL PATHOLOGY

## 2020-06-01 DIAGNOSIS — F321 Major depressive disorder, single episode, moderate: Secondary | ICD-10-CM | POA: Diagnosis not present

## 2020-06-01 DIAGNOSIS — F411 Generalized anxiety disorder: Secondary | ICD-10-CM | POA: Diagnosis not present

## 2020-06-07 DIAGNOSIS — F321 Major depressive disorder, single episode, moderate: Secondary | ICD-10-CM | POA: Diagnosis not present

## 2020-06-07 DIAGNOSIS — F411 Generalized anxiety disorder: Secondary | ICD-10-CM | POA: Diagnosis not present

## 2020-06-21 DIAGNOSIS — F411 Generalized anxiety disorder: Secondary | ICD-10-CM | POA: Diagnosis not present

## 2020-06-21 DIAGNOSIS — F321 Major depressive disorder, single episode, moderate: Secondary | ICD-10-CM | POA: Diagnosis not present

## 2020-07-05 DIAGNOSIS — F321 Major depressive disorder, single episode, moderate: Secondary | ICD-10-CM | POA: Diagnosis not present

## 2020-07-05 DIAGNOSIS — F411 Generalized anxiety disorder: Secondary | ICD-10-CM | POA: Diagnosis not present

## 2020-07-19 DIAGNOSIS — F321 Major depressive disorder, single episode, moderate: Secondary | ICD-10-CM | POA: Diagnosis not present

## 2020-07-19 DIAGNOSIS — F411 Generalized anxiety disorder: Secondary | ICD-10-CM | POA: Diagnosis not present

## 2020-08-02 DIAGNOSIS — F411 Generalized anxiety disorder: Secondary | ICD-10-CM | POA: Diagnosis not present

## 2020-08-02 DIAGNOSIS — F321 Major depressive disorder, single episode, moderate: Secondary | ICD-10-CM | POA: Diagnosis not present

## 2020-08-09 ENCOUNTER — Observation Stay (HOSPITAL_COMMUNITY)
Admission: EM | Admit: 2020-08-09 | Discharge: 2020-08-10 | Disposition: A | Discharge status: Home / Self Care | Payer: BC Managed Care – PPO | Attending: Internal Medicine | Admitting: Internal Medicine

## 2020-08-09 ENCOUNTER — Ambulatory Visit
Admission: EM | Admit: 2020-08-09 | Discharge: 2020-08-09 | Disposition: A | Discharge status: Home / Self Care | Payer: BC Managed Care – PPO | Attending: Family Medicine | Admitting: Family Medicine

## 2020-08-09 ENCOUNTER — Encounter (HOSPITAL_COMMUNITY): Payer: Self-pay

## 2020-08-09 ENCOUNTER — Other Ambulatory Visit: Payer: Self-pay

## 2020-08-09 ENCOUNTER — Telehealth: Payer: Self-pay | Admitting: Family Medicine

## 2020-08-09 ENCOUNTER — Observation Stay (HOSPITAL_COMMUNITY): Payer: BC Managed Care – PPO

## 2020-08-09 ENCOUNTER — Encounter: Payer: Self-pay | Admitting: *Deleted

## 2020-08-09 DIAGNOSIS — N939 Abnormal uterine and vaginal bleeding, unspecified: Secondary | ICD-10-CM

## 2020-08-09 DIAGNOSIS — E282 Polycystic ovarian syndrome: Secondary | ICD-10-CM | POA: Diagnosis not present

## 2020-08-09 DIAGNOSIS — D649 Anemia, unspecified: Principal | ICD-10-CM | POA: Diagnosis present

## 2020-08-09 DIAGNOSIS — Z20822 Contact with and (suspected) exposure to covid-19: Secondary | ICD-10-CM | POA: Diagnosis not present

## 2020-08-09 DIAGNOSIS — D509 Iron deficiency anemia, unspecified: Secondary | ICD-10-CM

## 2020-08-09 DIAGNOSIS — N8500 Endometrial hyperplasia, unspecified: Secondary | ICD-10-CM | POA: Diagnosis not present

## 2020-08-09 DIAGNOSIS — D259 Leiomyoma of uterus, unspecified: Secondary | ICD-10-CM | POA: Diagnosis not present

## 2020-08-09 HISTORY — DX: Polycystic ovarian syndrome: E28.2

## 2020-08-09 LAB — POCT URINE PREGNANCY: Preg Test, Ur: NEGATIVE

## 2020-08-09 LAB — CBC WITH DIFFERENTIAL/PLATELET
Abs Immature Granulocytes: 0.07 10*3/uL (ref 0.00–0.07)
Basophils Absolute: 0 10*3/uL (ref 0.0–0.2)
Basophils Absolute: 0 10*3/uL (ref 0.0–0.1)
Basophils Relative: 0 %
Basos: 0 %
EOS (ABSOLUTE): 0.1 10*3/uL (ref 0.0–0.4)
Eos: 1 %
Eosinophils Absolute: 0.1 10*3/uL (ref 0.0–0.5)
Eosinophils Relative: 1 %
HCT: 21.6 % — ABNORMAL LOW (ref 36.0–46.0)
Hematocrit: 22.6 % — ABNORMAL LOW (ref 34.0–46.6)
Hemoglobin: 6.1 g/dL — CL (ref 11.1–15.9)
Hemoglobin: 5.7 g/dL — CL (ref 12.0–15.0)
Immature Grans (Abs): 0.1 10*3/uL (ref 0.0–0.1)
Immature Granulocytes: 1 %
Immature Granulocytes: 1 %
Lymphocytes Absolute: 1.6 10*3/uL (ref 0.7–3.1)
Lymphocytes Relative: 20 %
Lymphs Abs: 1.4 10*3/uL (ref 0.7–4.0)
Lymphs: 20 %
MCH: 18.8 pg — ABNORMAL LOW (ref 26.6–33.0)
MCH: 19.2 pg — ABNORMAL LOW (ref 26.0–34.0)
MCHC: 27 g/dL — ABNORMAL LOW (ref 31.5–35.7)
MCHC: 26.4 g/dL — ABNORMAL LOW (ref 30.0–36.0)
MCV: 70 fL — ABNORMAL LOW (ref 79–97)
MCV: 72.7 fL — ABNORMAL LOW (ref 80.0–100.0)
Monocytes Absolute: 0.5 10*3/uL (ref 0.1–0.9)
Monocytes Absolute: 0.4 10*3/uL (ref 0.1–1.0)
Monocytes Relative: 5 %
Monocytes: 7 %
NRBC: 1 % — ABNORMAL HIGH (ref 0–0)
Neutro Abs: 5.2 10*3/uL (ref 1.7–7.7)
Neutrophils Absolute: 5.7 10*3/uL (ref 1.4–7.0)
Neutrophils Relative %: 73 %
Neutrophils: 71 %
Platelets: 305 10*3/uL (ref 150–450)
Platelets: 296 10*3/uL (ref 150–400)
RBC: 3.24 x10E6/uL — ABNORMAL LOW (ref 3.77–5.28)
RBC: 2.97 MIL/uL — ABNORMAL LOW (ref 3.87–5.11)
RDW: 17.7 % — ABNORMAL HIGH (ref 11.7–15.4)
RDW: 18.7 % — ABNORMAL HIGH (ref 11.5–15.5)
WBC: 8.1 10*3/uL (ref 3.4–10.8)
WBC: 7.1 10*3/uL (ref 4.0–10.5)
nRBC: 1 % — ABNORMAL HIGH (ref 0.0–0.2)

## 2020-08-09 LAB — BASIC METABOLIC PANEL
Anion gap: 9 (ref 5–15)
BUN: 11 mg/dL (ref 6–20)
CO2: 23 mmol/L (ref 22–32)
Calcium: 9.5 mg/dL (ref 8.9–10.3)
Chloride: 111 mmol/L (ref 98–111)
Creatinine, Ser: 0.71 mg/dL (ref 0.44–1.00)
GFR, Estimated: >60 mL/min (ref >60)
Glucose, Bld: 145 mg/dL — ABNORMAL HIGH (ref 70–99)
Potassium: 3.9 mmol/L (ref 3.5–5.1)
Sodium: 143 mmol/L (ref 135–145)

## 2020-08-09 LAB — WET PREP, GENITAL
Sperm: NONE SEEN
Trich, Wet Prep: NONE SEEN
WBC, Wet Prep HPF POC: NONE SEEN
Yeast Wet Prep HPF POC: NONE SEEN

## 2020-08-09 LAB — URINALYSIS, ROUTINE W REFLEX MICROSCOPIC
Bacteria, UA: NONE SEEN
Bilirubin Urine: NEGATIVE
Glucose, UA: NEGATIVE mg/dL
Ketones, ur: NEGATIVE mg/dL
Nitrite: NEGATIVE
Protein, ur: 100 mg/dL — AB
RBC / HPF: >50 RBC/hpf — ABNORMAL HIGH (ref 0–5)
Specific Gravity, Urine: 1.018 (ref 1.005–1.030)
pH: 6 (ref 5.0–8.0)

## 2020-08-09 LAB — I-STAT BETA HCG BLOOD, ED (MC, WL, AP ONLY): I-stat hCG, quantitative: <5 m[IU]/mL (ref <5)

## 2020-08-09 LAB — PREPARE RBC (CROSSMATCH)

## 2020-08-09 LAB — ABO/RH: ABO/RH(D): O NEG

## 2020-08-09 MED ORDER — ACETAMINOPHEN 325 MG PO TABS: 650.0000 mg | ORAL_TABLET | Freq: Four times a day (QID) | ORAL | Status: DC | PRN

## 2020-08-09 MED ORDER — SODIUM CHLORIDE 0.9% FLUSH: 3.0000 mL | INTRAVENOUS | Status: DC | PRN

## 2020-08-09 MED ORDER — ACETAMINOPHEN 650 MG RE SUPP: 650.0000 mg | Freq: Four times a day (QID) | RECTAL | Status: DC | PRN

## 2020-08-09 MED ORDER — MEGESTROL ACETATE 40 MG PO TABS: 40.0000 mg | ORAL_TABLET | Freq: Every day | ORAL | Status: DC

## 2020-08-09 MED ORDER — MEGESTROL ACETATE 40 MG PO TABS
40.0000 mg | ORAL_TABLET | Freq: Three times a day (TID) | ORAL | Status: DC
  Administered 2020-08-10 (×2): 40 mg via ORAL
  Filled 2020-08-09 (×5): qty 1

## 2020-08-09 MED ORDER — SODIUM CHLORIDE 0.9 % IV SOLN: 250.0000 mL | INTRAVENOUS | Status: DC | PRN

## 2020-08-09 MED ORDER — SODIUM CHLORIDE 0.9% FLUSH
3.0000 mL | Freq: Two times a day (BID) | INTRAVENOUS | Status: DC
  Administered 2020-08-09 – 2020-08-10 (×2): 3 mL via INTRAVENOUS

## 2020-08-09 MED ORDER — DROSPIRENONE 4 MG PO TABS: 1.0000 | ORAL_TABLET | Freq: Every day | ORAL | Status: DC

## 2020-08-09 MED ORDER — SODIUM CHLORIDE 0.9 % IV SOLN: 10.0000 mL/h | Freq: Once | INTRAVENOUS | Status: DC

## 2020-08-09 NOTE — H&P (Signed)
History and Physical    COURTNIE BRENES ZTI:[id number] DOB: Nov 24, 1985 DOA: 08/09/2020  PCP: Shelly Bombard, MD   Patient coming from:  Home  Chief Complaint: Vaginal bleeding-heavy  HPI: Eileen Todd is a 35 y.o. female with medical history significant for PCOS who presents for evaluation of heavy vaginal bleeding for the last month and a half.  She reports that she began to have heavy vaginal bleeding in mid March.  She states that the last few days the bleeding has been heavier.  She has gone through an overnight pad every 1-2 hours over the last few days.  She states she has shortness of breath when she exerts herself and feels a little dizzy when she first stands up.  She has not had any syncope.  She does have a history of PCOS and has had heavy vaginal bleeding in the past that did require a blood transfusion in November 2021.  She has had 3 polyps removed from her uterine cavity in the past when a D&C was performed.  She has never been pregnant.  She was treated for vaginal bleeding by GYN in the past with Megace and and placed on birth control pills.  She stopped taking birth control pills in January of this year as she and her husband are trying to get pregnant.  She reports that when she stopped birth control pills a few months later she has increase in her bleeding.  She currently has an appointment to see OB/GYN as an outpatient but appointment is not until the end of the month.  She was seen at her PCP office and her blood work drawn and had a hemoglobin of 6.5 is called and told to come to the emergency room.  No fever, abdominal pain or cramps.  ED Course: In the emergency room she has tachycardia but is otherwise hemodynamically stable.  Hemoglobin is 5.7.  Review of Systems:  General: Denies weakness, fever, chills, weight loss, night sweats.  Denies dizziness.   HENT: Denies headache, denies change in hearing, tinnitus. Denies nasal bleeding.  Denies sore throat, sores in mouth.   Denies difficulty swallowing Eyes: Denies blurry vision, pain in eye, drainage.  Denies discoloration of eyes. Neck: Denies pain.  Denies swelling.  Denies pain with movement. Cardiovascular: Denies chest pain, palpitations.  Denies edema.  Denies orthopnea Respiratory: Denies shortness of breath, cough.  Denies wheezing.  Denies sputum production Gastrointestinal: Denies abdominal pain, swelling.  Denies nausea, vomiting, diarrhea.  Denies melena.  Denies hematemesis. Musculoskeletal: Denies limitation of movement.  Denies deformity or swelling.  Denies pain.  Denies arthralgias or myalgias. Genitourinary: Heavy vaginal bleeding. Denies pelvic pain.  Denies urinary frequency or hesitancy.  Denies dysuria.  Skin: Denies rash.  Denies petechiae, purpura, ecchymosis. Neurological: Denies headache.  Denies syncope.  Denies seizure activity.  Denies weakness or paresthesia.  Denies slurred speech, drooping face.  Denies visual change. Psychiatric: Denies depression, anxiety. Denies hallucinations.  Past Medical History:  Diagnosis Date  . Family history of adverse reaction to anesthesia   . PCOS (polycystic ovarian syndrome)     Past Surgical History:  Procedure Laterality Date  . CHOLECYSTECTOMY N/A 12/18/2019   Procedure: LAPAROSCOPIC CHOLECYSTECTOMY;  Surgeon: Kinsinger, Arta Bruce, MD;  Location: [facility];  Service: General;  Laterality: N/A;  . DILATION AND CURETTAGE OF UTERUS    . WISDOM TOOTH EXTRACTION      Social History  reports that she has never smoked. She has never used smokeless tobacco.  No history on file for alcohol use and drug use.  No Known Allergies  History reviewed. No pertinent family history.   Prior to Admission medications   Medication Sig Start Date End Date Taking? Authorizing Provider  Ferrous Sulfate (IRON PO) Take 1 tablet by mouth daily.   Yes [provider]  SLYND 4 MG TABS Take 1 tablet by mouth at bedtime. 11/25/19  Yes [provider]  vitamin B-12 (CYANOCOBALAMIN) 1000 MCG tablet Take 2,000 mcg by mouth daily.   Yes [provider]  ibuprofen (ADVIL) 800 MG tablet Take 1 tablet (800 mg total) by mouth every 8 (eight) hours as needed. Patient not taking: Reported on 08/09/2020 12/18/19   Kinsinger, Arta Bruce, MD  oxyCODONE (OXY IR/ROXICODONE) 5 MG immediate release tablet Take 1 tablet (5 mg total) by mouth every 6 (six) hours as needed for severe pain. Patient not taking: Reported on 08/09/2020 12/18/19   Kinsinger, Arta Bruce, MD    Physical Exam: Vitals:   08/09/20 1845 08/09/20 1900 08/09/20 1915 08/09/20 2000  BP: 136/75   (!) 145/132  Pulse: (!) 124 (!) 151 (!) 119 (!) 126  Resp: 18   16  Temp:      TempSrc:      SpO2: 97% 100% 100% 91%  Weight:      Height:        Constitutional: NAD, calm, comfortable Vitals:   08/09/20 1845 08/09/20 1900 08/09/20 1915 08/09/20 2000  BP: 136/75   (!) 145/132  Pulse: (!) 124 (!) 151 (!) 119 (!) 126  Resp: 18   16  Temp:      TempSrc:      SpO2: 97% 100% 100% 91%  Weight:      Height:       General: WDWN, Alert and oriented x3.  Eyes: EOMI, PERRL,  Conjunctivae pale.  Sclera nonicteric HENT:  [place]/AT, external ears normal.  Nares patent without epistasis.  Mucous membranes are moist. Posterior pharynx clear of any exudate or lesions. Normal dentition.  Neck: Soft, normal range of motion, supple, no masses, no thyromegaly.  Trachea midline Respiratory: clear to auscultation bilaterally, no wheezing, no crackles. Normal respiratory effort. No accessory muscle use.  Cardiovascular: Regular rhythm, tachycardia, no murmurs / rubs / gallops. No extremity edema. 2+ pedal pulses.  Abdomen: Soft, no tenderness, nondistended, no rebound or guarding. Morbidly obese. No masses palpated. Bowel sounds normoactive Musculoskeletal: FROM. no cyanosis. No joint deformity upper and lower extremities. Normal muscle tone.  GU: deferred as done by ER provider. Skin: Warm, dry,  intact no rashes, lesions, ulcers. No induration Neurologic: CN 2-12 grossly intact.  Normal speech.  Sensation intact, Strength 5/5 in all extremities.   Psychiatric: Normal judgment and insight.  Normal mood.    Labs on Admission: I have personally reviewed following labs and imaging studies  CBC: Recent Labs  Lab 08/09/20 1240 08/09/20 1740  WBC 8.1 7.1  NEUTROABS 5.7 5.2  HGB 6.1* 5.7*  HCT 22.6* 21.6*  MCV 70* 72.7*  PLT 305 017    Basic Metabolic Panel: Recent Labs  Lab 08/09/20 1740  NA 143  K 3.9  CL 111  CO2 23  GLUCOSE 145*  BUN 11  CREATININE 0.71  CALCIUM 9.5    GFR: Estimated Creatinine Clearance: 137.1 mL/min (by C-G formula based on SCr of 0.71 mg/dL).  Liver Function Tests: No results for input(s): AST, ALT, ALKPHOS, BILITOT, PROT, ALBUMIN in the last 168 hours.  Urine analysis:  Component Value Date/Time   COLORURINE RED (A) 08/09/2020 1844   APPEARANCEUR CLOUDY (A) 08/09/2020 1844   LABSPEC 1.018 08/09/2020 1844   PHURINE 6.0 08/09/2020 1844   GLUCOSEU NEGATIVE 08/09/2020 1844   HGBUR LARGE (A) 08/09/2020 1844   BILIRUBINUR NEGATIVE 08/09/2020 [place] 08/09/2020 1844   PROTEINUR 100 (A) 08/09/2020 1844   NITRITE NEGATIVE 08/09/2020 1844   LEUKOCYTESUR SMALL (A) 08/09/2020 1844    Radiological Exams on Admission: No results found.   Assessment/Plan Principal Problem:   Symptomatic anemia Ms. Kuzel is placed on Med/Surg for observation.  2 units of PRBC ordered.  Recheck CBC in am.   Active Problems:   Vaginal bleeding Megace tid per Gyn recommendation.  Pelvic U/S in am.     PCOS (polycystic ovarian syndrome) Chronic. Follow up with Gyn.     DVT prophylaxis:     [place] score is low.  Early ambulation for DVT prophylaxis Code Status:   Full code Family Communication:  Diagnosis and plan discussed with patient and her husband who is at bedside.  Questions answered.  Further recommendations to follow as  clinically indicated Disposition Plan:   Patient is from:  Home  Anticipated DC to:  Home  Anticipated DC date:  Anticipate less than 2 midnight stay in the hospital  Anticipated DC barriers: No barriers to discharge identified at this time   Consult:  Gyn called by ER provider Admission status:  Observation   Yevonne Aline Wray Goehring MD [facility]  How to contact the [facility] Attending or Consulting provider [place] or covering provider during after hours [place], for this patient?   1. Check the care team in [facility] and look for a) attending/consulting [facility] provider listed and b) the [facility] team listed 2. Log into [email or web address] and use [place]'s universal password to access. If you do not have the password, please contact the hospital operator. 3. Locate the [facility] provider you are looking for under [facility] and page to a number that you can be directly reached. 4. If you still have difficulty reaching the provider, please page the [facility] (Director on Call) for the Hospitalists listed on amion for assistance.  08/09/2020, 8:28 PM

## 2020-08-09 NOTE — ED Provider Notes (Signed)
[facility]    CSN: [id number] Arrival date & time: 08/09/20  1153      History   Chief Complaint Chief Complaint  Patient presents with  . Vaginal Bleeding    HPI Eileen Todd is a 35 y.o. female.   HPI  Patient with a history of polycystic ovarian syndrome presents today with nearly 2 months of persistent vaginal bleeding.  She has an appointment scheduled in 2 weeks with her OB/GYN however came in to be evaluated for anemia as she has had a history of multiple blood transfusions and a history of a hemoglobin dropping as low as 3.  She endorses some fatigue and she is changing her pads approximately every 2 hours.  She is actively on birth control pills for control of vaginal bleeding however as of March bleeding has occurred more persistently. She also takes a daily iron pill and reports she is consistent with taking supplements.  She denies any vaginal or abdominal pain.  Past Medical History:  Diagnosis Date  . Family history of adverse reaction to anesthesia   . PCOS (polycystic ovarian syndrome)     Patient Active Problem List   Diagnosis Date Noted  . Acute cholecystitis due to biliary calculus 12/17/2019    Past Surgical History:  Procedure Laterality Date  . CHOLECYSTECTOMY N/A 12/18/2019   Procedure: LAPAROSCOPIC CHOLECYSTECTOMY;  Surgeon: Kinsinger, Arta Bruce, MD;  Location: [place];  Service: General;  Laterality: N/A;  . DILATION AND CURETTAGE OF UTERUS    . WISDOM TOOTH EXTRACTION      OB History   No obstetric history on file.      Home Medications    Prior to Admission medications   Medication Sig Start Date End Date Taking? Authorizing Provider  ibuprofen (ADVIL) 800 MG tablet Take 1 tablet (800 mg total) by mouth every 8 (eight) hours as needed. 12/18/19   Kinsinger, Arta Bruce, MD  metFORMIN (GLUCOPHAGE) 500 MG tablet Take 500 mg by mouth 3 (three) times daily. 11/14/19   [provider]  oxyCODONE (OXY IR/ROXICODONE) 5 MG  immediate release tablet Take 1 tablet (5 mg total) by mouth every 6 (six) hours as needed for severe pain. 12/18/19   Kinsinger, Arta Bruce, MD  SLYND 4 MG TABS Take 1 tablet by mouth daily. 11/25/19   [provider]    Family History No family history on file.  Social History Social History   Tobacco Use  . Smoking status: Never Smoker  . Smokeless tobacco: Never Used     Allergies   Patient has no known allergies.   Review of Systems Review of Systems Pertinent negatives listed in HPI   Physical Exam Triage Vital Signs ED Triage Vitals  Enc Vitals Group     BP 08/09/20 1206 (!) 151/95     Pulse Rate 08/09/20 1206 (!) 104     Resp 08/09/20 1206 18     Temp 08/09/20 1206 98.5 F (36.9 C)     Temp Source 08/09/20 1206 Oral     SpO2 08/09/20 1206 99 %     Weight --      Height --      Head Circumference --      Peak Flow --      Pain Score 08/09/20 1210 0     Pain Loc --      Pain Edu? --      Excl. in [place]? --    No data found.  Updated  Vital Signs BP (!) 151/95 (BP Location: Right Arm)   Pulse (!) 104   Temp 98.5 F (36.9 C) (Oral)   Resp 18   LMP 08/09/2020   SpO2 99%   Visual Acuity Right Eye Distance:   Left Eye Distance:   Bilateral Distance:    Right Eye Near:   Left Eye Near:    Bilateral Near:     Physical Exam General appearance: Alert, not acutely ill-appearing, no distress, cooperative Head: Normocephalic, without obvious abnormality, atraumatic Respiratory: Respirations even and unlabored, normal respiratory rate Heart: rate and rhythm normal. No gallop or murmurs noted on exam  Abdomen: BS +, no distention, no rebound tenderness, or no mass Skin: Skin color, texture, turgor normal. No rashes seen  Psych: Appropriate mood and affect. Neurologic: GCS 15, normal coordination, normal gait UC Treatments / Results  Labs (all labs ordered are listed, but only abnormal results are displayed) Labs Reviewed  CBC WITH  DIFFERENTIAL/PLATELET  POCT URINE PREGNANCY    EKG   Radiology No results found.  Procedures Procedures (including critical care time)  Medications Ordered in UC Medications - No data to display  Initial Impression / Assessment and Plan / UC Course  I have reviewed the triage vital signs and the nursing notes.  Pertinent labs & imaging results that were available during my care of the patient were reviewed by me and considered in my medical decision making (see chart for details).     Patient presents today with vaginal bleeding and a history of iron deficiency anemia related to polycystic ovarian syndrome, CBC pending stat to rule out critical anemia.  Patient advised I will contact her once lab results are available.  Advised if at any point in her symptoms worsens or she becomes symptomatic of dizziness or weakness or fatigue to go immediately to the ER.  Final Clinical Impressions(s) / UC Diagnoses   Final diagnoses:  Vaginal bleeding  Iron deficiency anemia, unspecified iron deficiency anemia type     Discharge Instructions     I will contact you once I receive your blood work.  Follow-up with [facility] for evaluation of vaginal.     ED Prescriptions    None     PDMP not reviewed this encounter.   Scot Jun, FNP 08/09/20 1336

## 2020-08-09 NOTE — ED Triage Notes (Signed)
Pt reports HGB of 6.1 with hx of PCOS. intermittent vaginal bleeding x2 months.

## 2020-08-09 NOTE — Telephone Encounter (Signed)
Contacted patient who was seen today in relation to possible symptomatic anemia CBC was returned with a critical result of 6.1.  Patient notified via phone after verifying identity.  Patient advised to follow-up immediately at [facility] for further management of anemia.

## 2020-08-09 NOTE — ED Provider Notes (Signed)
Emergency Medicine Provider Triage Evaluation Note  Eileen Todd , a 35 y.o. female  was evaluated in triage.  Pt complains of vaginal bleeding.  Has been constant since mid March.  Is going through overnight pad every 2 hours but sometimes twice in 1 hour.  Endorses exertional shortness of breath over the aslt few days.    Patient was seen by her primary care provider earlier today patient's test negative, hemoglobin 6.1 and hematocrit 22.6  Review of Systems  Positive: Vaginal bleeding, exertional shob Negative: Chest pain, fatigue, syncope, vaginal pain, vaginal discharge, pelvic pain  Physical Exam  BP (!) 179/95 (BP Location: Left Arm)   Pulse (!) 121   Temp 98.2 F (36.8 C) (Oral)   Resp (!) 22   LMP 08/09/2020   SpO2 100%  Gen:   Awake, no distress   Resp:  Normal effort  MSK:   Moves extremities without difficulty  Other:  Abdomen soft, nondistended, nontender  Medical Decision Making  Medically screening exam initiated at 4:40 PM.  Appropriate orders placed.  KEVYN WENGERT was informed that the remainder of the evaluation will be completed by another provider, this initial triage assessment does not replace that evaluation, and the importance of remaining in the ED until their evaluation is complete.  The patient appears stable so that the remainder of the work up may be completed by another provider.      Loni Beckwith, PA-C 08/09/20 1647    Drenda Freeze, MD 08/11/20 1535

## 2020-08-09 NOTE — ED Provider Notes (Signed)
[facility] DEPT Provider Note   CSN: [id number] Arrival date & time: 08/09/20  1633     History Chief Complaint  Patient presents with  . Vaginal Bleeding    Eileen Todd is a 35 y.o. female with past medical history of PCOS previous episodes of heavy dysfunctional uterine bleeding.  Patient states that previously when she lived in [place] she had to be hospitalized because she bled down to a hemoglobin of 3.  Patient states that the last time that she had heavy bleeding was in November of this year.  She had come off of all of her birth control as she is trying to get pregnant however bleeding became severe and she was placed on Megace with rapid resolution of her bleeding and has been on oral contraceptive pills since that time.  In the past this is also been effective in controlling her bleeding however she had onset of bleeding in March despite taking her oral contraceptives.  She did have episodes of heavy clotting and bleeding lasting 4 to 5 days with a couple days of remittance and then repeat episodes of bleeding.  She has been bleeding heavily since the end of April with bleeding soaking through heavy pads every 2 hours sometimes soaking through a pad in 1 hour.  Over the past few days she has become weak, short of breath with exertion.  She saw her primary care doctor who took a hemoglobin and noted that it was very low sent her to the emergency department.  She denies any abdominal pain.  HPI     Past Medical History:  Diagnosis Date  . Family history of adverse reaction to anesthesia   . PCOS (polycystic ovarian syndrome)     Patient Active Problem List   Diagnosis Date Noted  . Acute cholecystitis due to biliary calculus 12/17/2019    Past Surgical History:  Procedure Laterality Date  . CHOLECYSTECTOMY N/A 12/18/2019   Procedure: LAPAROSCOPIC CHOLECYSTECTOMY;  Surgeon: Kinsinger, Arta Bruce, MD;  Location: [place];  Service: General;   Laterality: N/A;  . DILATION AND CURETTAGE OF UTERUS    . WISDOM TOOTH EXTRACTION       OB History   No obstetric history on file.     History reviewed. No pertinent family history.  Social History   Tobacco Use  . Smoking status: Never Smoker  . Smokeless tobacco: Never Used    Home Medications Prior to Admission medications   Medication Sig Start Date End Date Taking? Authorizing Provider  ibuprofen (ADVIL) 800 MG tablet Take 1 tablet (800 mg total) by mouth every 8 (eight) hours as needed. 12/18/19   Kinsinger, Arta Bruce, MD  metFORMIN (GLUCOPHAGE) 500 MG tablet Take 500 mg by mouth 3 (three) times daily. 11/14/19   [provider]  oxyCODONE (OXY IR/ROXICODONE) 5 MG immediate release tablet Take 1 tablet (5 mg total) by mouth every 6 (six) hours as needed for severe pain. 12/18/19   Kinsinger, Arta Bruce, MD  SLYND 4 MG TABS Take 1 tablet by mouth daily. 11/25/19   [provider]    Allergies    Patient has no known allergies.  Review of Systems   Review of Systems Ten systems reviewed and are negative for acute change, except as noted in the HPI.   Physical Exam Updated Vital Signs BP (!) 179/95 (BP Location: Left Arm)   Pulse (!) 121   Temp 98.2 F (36.8 C) (Oral)   Resp Marland Kitchen)  22   Ht 5\' 7"  (1.702 m)   Wt 124.7 kg   LMP 08/09/2020   SpO2 100%   BMI 43.07 kg/m   Physical Exam Vitals and nursing note reviewed.  Constitutional:      General: She is not in acute distress.    Appearance: She is well-developed. She is not diaphoretic.  HENT:     Head: Normocephalic and atraumatic.  Eyes:     General: No scleral icterus.    Conjunctiva/sclera: Conjunctivae normal.  Cardiovascular:     Rate and Rhythm: Regular rhythm. Tachycardia present.     Heart sounds: Normal heart sounds. No murmur heard. No friction rub. No gallop.   Pulmonary:     Effort: Pulmonary effort is normal. No respiratory distress.     Breath sounds: Normal breath sounds.   Abdominal:     General: Bowel sounds are normal. There is no distension.     Palpations: Abdomen is soft. There is no mass.     Tenderness: There is no abdominal tenderness. There is no guarding.  Genitourinary:    Comments: Pelvic exam: VULVA: normal appearing vulva with no masses, tenderness or lesions, VAGINA: normal appearing vagina with normal color and discharge, no lesions, CERVIX: bleeding from the cervical os - , UTERUS: uterus is normal size, shape, consistency and nontender, ADNEXA: normal adnexa in size, nontender and no masses.  Musculoskeletal:     Cervical back: Normal range of motion.  Skin:    General: Skin is warm and dry.  Neurological:     Mental Status: She is alert and oriented to person, place, and time.  Psychiatric:        Behavior: Behavior normal.     ED Results / Procedures / Treatments   Labs (all labs ordered are listed, but only abnormal results are displayed) Labs Reviewed  CBC WITH DIFFERENTIAL/PLATELET  BASIC METABOLIC PANEL  TYPE AND SCREEN  PREPARE RBC (CROSSMATCH)    EKG None  Radiology No results found.  Procedures .Critical Care Performed by: Margarita Mail, PA-C Authorized by: Margarita Mail, PA-C   Critical care provider statement:    Critical care time (minutes):  60   Critical care time was exclusive of:  Separately billable procedures and treating other patients   Critical care was necessary to treat or prevent imminent or life-threatening deterioration of the following conditions:  Circulatory failure   Critical care was time spent personally by me on the following activities:  Discussions with consultants, evaluation of patient's response to treatment, examination of patient, ordering and performing treatments and interventions, ordering and review of laboratory studies, ordering and review of radiographic studies, pulse oximetry, re-evaluation of patient's condition, obtaining history from patient or surrogate and review of  old charts     Medications Ordered in ED Medications  0.9 %  sodium chloride infusion (has no administration in time range)    ED Course  I have reviewed the triage vital signs and the nursing notes.  Pertinent labs & imaging results that were available during my care of the patient were reviewed by me and considered in my medical decision making (see chart for details).    MDM Rules/Calculators/A&P                           35 year old female here with abnormal uterine bleeding.  She is hemodynamically unstable with persistent tachycardia exertional dyspnea.  Her labs show her hemoglobin is 5.7 with microcytic anemia consistent  with hemorrhage.  BMP with mildly elevated blood glucose.  Urine is contaminated.  Wet prep shows clue cells but she has no other vaginal symptoms this is likely due to alkalosis from constant bleeding.  I am not going to treat her for BV at this time.  Patient receiving 2 units of blood for transfusion here in the emergency department.  I discussed the case with Dr. Arlina Robes of the GYN service who recommends Megace 40 mg 3 times daily until she follows up in their clinic and a GYN ultrasound.  Case discussed with Dr.Chottiner who will admit the patient for [facility]. Final Clinical Impression(s) / ED Diagnoses Final diagnoses:  Symptomatic anemia  Episode of heavy vaginal bleeding  Vaginal bleeding    Rx / DC Orders ED Discharge Orders    None       Margarita Mail, PA-C 08/09/20 2117    Lennice Sites, DO 08/09/20 2326

## 2020-08-09 NOTE — ED Triage Notes (Signed)
PCOS  Hx and Pt is setting up with a new GYN/OB . Her first appt is the 19th . Pt reports passing clots with low HGB. Pt ha been admitted for HGB of 3 .

## 2020-08-09 NOTE — ED Notes (Signed)
Date and time results received: 08/09/20  1920 (use smartphrase ".now" to insert current time)  Test: Hgb  Critical Value: 5.7  Name of Provider Notified: A.Harris PA  Orders Received? Or Actions Taken?: Orders Received - See Orders for details

## 2020-08-09 NOTE — Discharge Instructions (Addendum)
I will contact you once I receive your blood work.  Follow-up with [facility] for evaluation of vaginal.

## 2020-08-10 ENCOUNTER — Encounter (HOSPITAL_COMMUNITY): Payer: Self-pay | Admitting: Family Medicine

## 2020-08-10 DIAGNOSIS — D259 Leiomyoma of uterus, unspecified: Secondary | ICD-10-CM | POA: Diagnosis not present

## 2020-08-10 DIAGNOSIS — D649 Anemia, unspecified: Secondary | ICD-10-CM | POA: Diagnosis not present

## 2020-08-10 DIAGNOSIS — N939 Abnormal uterine and vaginal bleeding, unspecified: Secondary | ICD-10-CM

## 2020-08-10 DIAGNOSIS — D500 Iron deficiency anemia secondary to blood loss (chronic): Secondary | ICD-10-CM | POA: Diagnosis not present

## 2020-08-10 DIAGNOSIS — E282 Polycystic ovarian syndrome: Secondary | ICD-10-CM

## 2020-08-10 LAB — CBC
HCT: 28.6 % — ABNORMAL LOW (ref 36.0–46.0)
Hemoglobin: 8 g/dL — ABNORMAL LOW (ref 12.0–15.0)
MCH: 21.4 pg — ABNORMAL LOW (ref 26.0–34.0)
MCHC: 28 g/dL — ABNORMAL LOW (ref 30.0–36.0)
MCV: 76.5 fL — ABNORMAL LOW (ref 80.0–100.0)
Platelets: 287 10*3/uL (ref 150–400)
RBC: 3.74 MIL/uL — ABNORMAL LOW (ref 3.87–5.11)
RDW: 20.9 % — ABNORMAL HIGH (ref 11.5–15.5)
WBC: 7.3 10*3/uL (ref 4.0–10.5)
nRBC: 1 % — ABNORMAL HIGH (ref 0.0–0.2)

## 2020-08-10 LAB — GC/CHLAMYDIA PROBE AMP (~~LOC~~) NOT AT ARMC
Chlamydia: NEGATIVE
Comment: NEGATIVE
Comment: NORMAL
Neisseria Gonorrhea: NEGATIVE

## 2020-08-10 LAB — SARS CORONAVIRUS 2 (TAT 6-24 HRS): SARS Coronavirus 2: NEGATIVE

## 2020-08-10 MED ORDER — METRONIDAZOLE 500 MG PO TABS: 500.0000 mg | ORAL_TABLET | Freq: Two times a day (BID) | ORAL | 0 refills | Status: AC

## 2020-08-10 MED ORDER — NAPROXEN 500 MG PO TABS: 500.0000 mg | ORAL_TABLET | Freq: Two times a day (BID) | ORAL | 0 refills | Status: DC

## 2020-08-10 MED ORDER — MEGESTROL ACETATE 40 MG PO TABS
80.0000 mg | ORAL_TABLET | Freq: Two times a day (BID) | ORAL | Status: DC
  Filled 2020-08-10: qty 2

## 2020-08-10 MED ORDER — METRONIDAZOLE 500 MG PO TABS: 500.0000 mg | ORAL_TABLET | Freq: Two times a day (BID) | ORAL | 0 refills | Status: DC

## 2020-08-10 MED ORDER — FERROUS SULFATE 325 (65 FE) MG PO TABS: 325.0000 mg | ORAL_TABLET | Freq: Every day | ORAL | Status: DC

## 2020-08-10 MED ORDER — METRONIDAZOLE 500 MG PO TABS: 500.0000 mg | ORAL_TABLET | Freq: Two times a day (BID) | ORAL | Status: DC

## 2020-08-10 MED ORDER — FERROUS SULFATE 325 (65 FE) MG PO TABS: 325.0000 mg | ORAL_TABLET | Freq: Every day | ORAL | 0 refills | Status: DC

## 2020-08-10 MED ORDER — NAPROXEN 500 MG PO TABS: 500.0000 mg | ORAL_TABLET | Freq: Two times a day (BID) | ORAL | Status: DC

## 2020-08-10 MED ORDER — MEGESTROL ACETATE 40 MG PO TABS
80.0000 mg | ORAL_TABLET | Freq: Two times a day (BID) | ORAL | Status: DC
  Administered 2020-08-10: 80 mg via ORAL
  Filled 2020-08-10: qty 2

## 2020-08-10 MED ORDER — MEGESTROL ACETATE 40 MG PO TABS: 80.0000 mg | ORAL_TABLET | Freq: Two times a day (BID) | ORAL | 0 refills | Status: DC

## 2020-08-10 NOTE — Progress Notes (Signed)
Pt discharged home in stable condition. Discharge instructions given. Scripts sent to pharmacy of choice. No immediate questions or concerns at this time. Pt opted to ambulate off the unit.  

## 2020-08-10 NOTE — Discharge Summary (Signed)
Physician Discharge Summary  Eileen Todd PZW:[id number] DOB: 07-04-1985 DOA: 08/09/2020  PCP: Eileen Bombard, MD  Admit date: 08/09/2020 Discharge date: 08/10/2020  Admitted From: Home Disposition: Home  Recommendations for Outpatient Follow-up:  1. Follow up with PCP in 1-2 weeks 2. Please obtain BMP/CBC in one week 3. Please follow up with OB/GYN as scheduled  Home Health: None Equipment/Devices: None  Discharge Condition: Stable CODE STATUS: Full Diet recommendation: As tolerated  Brief/Interim Summary: Eileen Breeding Boskenis a 35 y.o.femalewith medical history significant forPCOS who presents for evaluationof heavy vaginal bleeding for the last month and a half. She reports that she began to have heavy vaginal bleeding in mid March. She states that the last few days the bleeding has been heavier. She has gone through an overnight pad every 1-2 hours over the last few days. She states she has shortness of breath when she exerts herself and feels a little dizzy when she first stands up. She has not had any syncope. She does have a history of PCOS and has had heavy vaginal bleeding in the past that did require a blood transfusion in November 2021. She has had 3 polyps removed from her uterine cavity in the past when a D&C was performed. She has never been pregnant. She was treated for vaginal bleeding by GYN in the past with Megace and and placed on birth control pills.She stopped taking birth control pills in January of this year as she and her husband are trying to get pregnant. She reports that when she stopped birth control pills a few months later she has increase in her bleeding. She currently has an appointment to see OB/GYN as an outpatient but appointment is not until the end of the month. She was seen at her PCP office and her blood work drawn and had a hemoglobin of 6.5is called and told to come to the emergency room.No fever, abdominal pain or cramps.  Patient  admitted as above with acute symptomatic anemia in the setting of PCOS and heavy vaginal bleeding.  After transfusion patient's hemoglobin improved to 8, her baseline appears to be around 12 or 13 per her distant history although records are limited given she just moved back here from [place].  Regardless OB/GYN was consulted, following along recommending Megace 80 twice daily ongoing and 500 mg naproxen twice daily for 7 days.  Patient will have close follow-up with OB/GYN in the outpatient setting.  We discussed that should she have worsening symptoms of bleeding or easily fatigued she should report back to either PCP OB/GYN or the ED for evaluation and testing.  Patient otherwise stable and agreeable for discharge home with close follow-up for symptomatic anemia in the setting of polycystic ovarian syndrome and profound vaginal bleeding.  Discharge Diagnoses:  Principal Problem:   Symptomatic anemia Active Problems:   Vaginal bleeding   PCOS (polycystic ovarian syndrome)    Discharge Instructions  Discharge Instructions    Call MD for:  extreme fatigue   Complete by: As directed    Diet - low sodium heart healthy   Complete by: As directed    Increase activity slowly   Complete by: As directed      Allergies as of 08/10/2020   No Known Allergies     Medication List    STOP taking these medications   ibuprofen 800 MG tablet Commonly known as: ADVIL   oxyCODONE 5 MG immediate release tablet Commonly known as: Oxy IR/ROXICODONE   Slynd 4 MG Tabs  Generic drug: Drospirenone     TAKE these medications   ferrous sulfate 325 (65 FE) MG tablet Take 1 tablet (325 mg total) by mouth daily with breakfast. Start taking on: Aug 11, 2020 What changed:   medication strength  how much to take  when to take this   megestrol 40 MG tablet Commonly known as: MEGACE Take 2 tablets (80 mg total) by mouth 2 (two) times daily.   metroNIDAZOLE 500 MG tablet Commonly known as:  FLAGYL Take 1 tablet (500 mg total) by mouth 2 (two) times daily for 7 days.   naproxen 500 MG tablet Commonly known as: NAPROSYN Take 1 tablet (500 mg total) by mouth 2 (two) times daily with a meal. Start taking on: Aug 11, 2020   vitamin B-12 1000 MCG tablet Commonly known as: CYANOCOBALAMIN Take 2,000 mcg by mouth daily.       No Known Allergies  Consultations:  OB/GYN, Eileen Todd   Procedures/Studies: US PELVIC COMPLETE WITH TRANSVAGINAL  Result Date: 08/09/2020 CLINICAL DATA:  Vaginal bleeding, history of polycystic ovarian syndrome EXAM: TRANSABDOMINAL AND TRANSVAGINAL ULTRASOUND OF PELVIS TECHNIQUE: Both transabdominal and transvaginal ultrasound examinations of the pelvis were performed. Transabdominal technique was performed for global imaging of the pelvis including uterus, ovaries, adnexal regions, and pelvic cul-de-sac. It was necessary to proceed with endovaginal exam following the transabdominal exam to visualize the endometrium and adnexal structures. COMPARISON:  12/17/2019 FINDINGS: Uterus Measurements: 9.2 x 5.8 by 6.5 cm = volume: 180 mL. Degenerating fibroids are seen within the uterine fundus, measuring 2.1 x 2.9 x 2.6 cm and 1.7 x 2.9 x 2.5 cm. Endometrium Thickness: 17 mm.  Endometrium is heterogeneous and thickened. Right ovary Measurements: 4.5 x 2.9 by 2.9 cm = volume: 19.4 ML. There is an exophytic follicle or paraovarian cyst in the right adnexa measuring 2.7 x 2.7 by 2.6 cm. This is simple in appearance without septation or mural thickening. Left ovary Measurements: 2.7 x 4.2 x 3.8 cm = volume: 23 mL. Numerous peripherally oriented follicles are seen, consistent with given history of polycystic ovarian syndrome. Other findings Trace free fluid in the pelvis. IMPRESSION: 1. Heterogeneous thickened endometrium measuring 17 mm. If bleeding remains unresponsive to hormonal or medical therapy, focal lesion work-up with sonohysterogram should be considered. Endometrial  biopsy should also be considered in pre-menopausal patients at high risk for endometrial carcinoma. (Ref: Radiological Reasoning: Algorithmic Workup of Abnormal Vaginal Bleeding with Endovaginal Sonography and Sonohysterography. AJR 2008; 195:K93-26) 2. Degenerating uterine fibroids. 3. Multiple follicles in the ovaries consistent with given history of polycystic ovarian syndrome. 4. Simple paraovarian cyst or exophytic follicle off the right ovary. Electronically Signed   By: Randa Ngo M.D.   On: 08/09/2020 22:24     Subjective: No acute issues or events overnight tolerated transfusion well, feels back to baseline requesting discharge home which is certainly reasonable given resolution of ongoing vaginal bleeding or symptoms.   Discharge Exam: Vitals:   08/10/20 1248 08/10/20 1438  BP: (!) 143/94 (!) 163/87  Pulse: 95 94  Resp: 15 15  Temp: 98.2 F (36.8 C) 98.1 F (36.7 C)  SpO2: 100% 100%   Vitals:   08/10/20 0452 08/10/20 0856 08/10/20 1248 08/10/20 1438  BP: 113/78 132/88 (!) 143/94 (!) 163/87  Pulse: 79 94 95 94  Resp: 16 15 15 15   Temp: 98.4 F (36.9 C) 98.1 F (36.7 C) 98.2 F (36.8 C) 98.1 F (36.7 C)  TempSrc: Oral Oral Oral Oral  SpO2: 98% 100%  100% 100%  Weight:      Height:        General: Pt is alert, awake, not in acute distress Cardiovascular: RRR, S1/S2 +, no rubs, no gallops Respiratory: CTA bilaterally, no wheezing, no rhonchi Abdominal: Soft, NT, ND, bowel sounds + Extremities: no edema, no cyanosis    The results of significant diagnostics from this hospitalization (including imaging, microbiology, ancillary and laboratory) are listed below for reference.     Microbiology: Recent Results (from the past 240 hour(s))  Wet prep, genital     Status: Abnormal   Collection Time: 08/09/20  6:56 PM   Specimen: PATH Cytology Cervicovaginal Ancillary Only  Result Value Ref Range Status   Yeast Wet Prep HPF POC NONE SEEN NONE SEEN Final   Trich, Wet  Prep NONE SEEN NONE SEEN Final   Clue Cells Wet Prep HPF POC PRESENT (A) NONE SEEN Final   WBC, Wet Prep HPF POC NONE SEEN NONE SEEN Final   Sperm NONE SEEN  Final    Comment: Performed at [facility], [place] [address], [place], [place] [address]  SARS CORONAVIRUS 2 (TAT 6-24 HRS) Nasopharyngeal Nasopharyngeal Swab     Status: None   Collection Time: 08/09/20  8:48 PM   Specimen: Nasopharyngeal Swab  Result Value Ref Range Status   SARS Coronavirus 2 NEGATIVE NEGATIVE Final    Comment: (NOTE) SARS-CoV-2 target nucleic acids are NOT DETECTED.  The SARS-CoV-2 RNA is generally detectable in upper and lower respiratory specimens during the acute phase of infection. Negative results do not preclude SARS-CoV-2 infection, do not rule out co-infections with other pathogens, and should not be used as the sole basis for treatment or other patient management decisions. Negative results must be combined with clinical observations, patient history, and epidemiological information. The expected result is Negative.  Fact Sheet for Patients: SugarRoll.be  Fact Sheet for Healthcare Providers: https://www.woods-mathews.com/  This test is not yet approved or cleared by the [place] FDA and  has been authorized for detection and/or diagnosis of SARS-CoV-2 by FDA under an Emergency Use Authorization (EUA). This EUA will remain  in effect (meaning this test can be used) for the duration of the COVID-19 declaration under Se ction 564(b)(1) of the Act, 21 U.S.C. section 360bbb-3(b)(1), unless the authorization is terminated or revoked sooner.  Performed at [facility], [place] [address], [place], [place] [address]      Labs: BNP (last 3 results) No results for input(s): BNP in the last 8760 hours. Basic Metabolic Panel: Recent Labs  Lab 08/09/20 1740  NA 143  K 3.9  CL 111  CO2 23  GLUCOSE 145*  BUN 11  CREATININE 0.71   CALCIUM 9.5   Liver Function Tests: No results for input(s): AST, ALT, ALKPHOS, BILITOT, PROT, ALBUMIN in the last 168 hours. No results for input(s): LIPASE, AMYLASE in the last 168 hours. No results for input(s): AMMONIA in the last 168 hours. CBC: Recent Labs  Lab 08/09/20 1240 08/09/20 1740 08/10/20 0915  WBC 8.1 7.1 7.3  NEUTROABS 5.7 5.2  --   HGB 6.1* 5.7* 8.0*  HCT 22.6* 21.6* 28.6*  MCV 70* 72.7* 76.5*  PLT 305 296 287   Cardiac Enzymes: No results for input(s): CKTOTAL, CKMB, CKMBINDEX, TROPONINI in the last 168 hours. BNP: Invalid input(s): POCBNP CBG: No results for input(s): GLUCAP in the last 168 hours. D-Dimer No results for input(s): DDIMER in the last 72 hours. Hgb A1c No results for input(s):  HGBA1C in the last 72 hours. Lipid Profile No results for input(s): CHOL, HDL, LDLCALC, TRIG, CHOLHDL, LDLDIRECT in the last 72 hours. Thyroid function studies No results for input(s): TSH, T4TOTAL, T3FREE, THYROIDAB in the last 72 hours.  Invalid input(s): FREET3 Anemia work up No results for input(s): VITAMINB12, FOLATE, FERRITIN, TIBC, IRON, RETICCTPCT in the last 72 hours. Urinalysis    Component Value Date/Time   COLORURINE RED (A) 08/09/2020 1844   APPEARANCEUR CLOUDY (A) 08/09/2020 1844   LABSPEC 1.018 08/09/2020 1844   PHURINE 6.0 08/09/2020 1844   GLUCOSEU NEGATIVE 08/09/2020 1844   HGBUR LARGE (A) 08/09/2020 1844   BILIRUBINUR NEGATIVE 08/09/2020 1844   KETONESUR NEGATIVE 08/09/2020 1844   PROTEINUR 100 (A) 08/09/2020 1844   NITRITE NEGATIVE 08/09/2020 1844   LEUKOCYTESUR SMALL (A) 08/09/2020 1844   Sepsis Labs Invalid input(s): PROCALCITONIN,  WBC,  LACTICIDVEN Microbiology Recent Results (from the past 240 hour(s))  Wet prep, genital     Status: Abnormal   Collection Time: 08/09/20  6:56 PM   Specimen: PATH Cytology Cervicovaginal Ancillary Only  Result Value Ref Range Status   Yeast Wet Prep HPF POC NONE SEEN NONE SEEN Final   Trich,  Wet Prep NONE SEEN NONE SEEN Final   Clue Cells Wet Prep HPF POC PRESENT (A) NONE SEEN Final   WBC, Wet Prep HPF POC NONE SEEN NONE SEEN Final   Sperm NONE SEEN  Final    Comment: Performed at [facility], [place] [address] [place], [place] [address]  SARS CORONAVIRUS 2 (TAT 6-24 HRS) Nasopharyngeal Nasopharyngeal Swab     Status: None   Collection Time: 08/09/20  8:48 PM   Specimen: Nasopharyngeal Swab  Result Value Ref Range Status   SARS Coronavirus 2 NEGATIVE NEGATIVE Final    Comment: (NOTE) SARS-CoV-2 target nucleic acids are NOT DETECTED.  The SARS-CoV-2 RNA is generally detectable in upper and lower respiratory specimens during the acute phase of infection. Negative results do not preclude SARS-CoV-2 infection, do not rule out co-infections with other pathogens, and should not be used as the sole basis for treatment or other patient management decisions. Negative results must be combined with clinical observations, patient history, and epidemiological information. The expected result is Negative.  Fact Sheet for Patients: SugarRoll.be  Fact Sheet for Healthcare Providers: https://www.woods-mathews.com/  This test is not yet approved or cleared by the [place] FDA and  has been authorized for detection and/or diagnosis of SARS-CoV-2 by FDA under an Emergency Use Authorization (EUA). This EUA will remain  in effect (meaning this test can be used) for the duration of the COVID-19 declaration under Se ction 564(b)(1) of the Act, 21 U.S.C. section 360bbb-3(b)(1), unless the authorization is terminated or revoked sooner.  Performed at [facility], [place] [address] [place], [place] [address]      Time coordinating discharge: Over 30 minutes  SIGNED:   Little Ishikawa, DO [facility] 08/10/2020, 5:44 PM Pager   If 7PM-7AM, please contact night-coverage [email or web address]

## 2020-08-10 NOTE — Consult Note (Signed)
OBSTETRICS AND GYNECOLOGY ATTENDING CONSULT NOTE  Consult Date: 08/10/2020  Reason for Consult: Symptomatic anemia, abnormal uterine bleeding, fibroids Consulting Provider: Dr. Holli Humbles    Assessment/Plan: Bleeding has subsided as per patient, hemoglobin appropriately increased from 5.7 to 8. No need for further transfusion as patient is asymptomatic.  Can give oral iron therapy. Megace increased to 80 mg po bid, she will continue on this for now. Naproxen 500 mg po bid x 7 days also ordered to help with AUB. Metronidazole 500 mg po bid x 7 days to treat bacterial vaginitis. Patient already has a follow up appointment in our office on 08/28/20; she will discuss further management at that point. She is stable to be discharged to home, this was communicated to her RN and Dr. Avon Gully.  Appreciate care of Eileen Todd by her primary team  Please call [phone] [facility] OB/GYN Consult Attending Monday-Friday 8am - 5pm) or [phone] [facility] OB/GYN Attending On Call all day, every day) for any gynecologic concerns at any time.  Thank you for involving [place] in the care of this patient.  Total consultation time including face-to-face time with patient (>50% of time), reviewing chart and documentation: 14 minutes  Verita Schneiders, MD, [place], [facility] for [place], [place] Phone: [phone]     History of Present Illness: Eileen Todd is an 35 y.o. G77 female who was admitted for symptomatic anemia with hemoglobin of 5.7 due to heavy vaginal bleeding.  Patient has a history of abnormal uterine bleeding (AUB) and PCOS, has been treated with OCPs and progestins in the past. Also underwent polypectomy in the past and has received a transfusion in 02/2020.  Bleeding was controlled on OCPs but she stopped them due to desire for conception, and had increase in bleeding noted since then.  She reported shortness of  breath, dizziness and was evaluated in her PCP's office and diagnosed with anemia. Was sent to ER for evaluation, then Dr. Rip Harbour (my GYN colleague) was called who recommended Megace 40 mg tid and transfusion.  Patient was admitted to [facility] service here at [facility].  Today, a face to face GYN consult was requested.    On encounter with patient, she reports decreased bleeding, no symptoms upon ambulating. Still having moderate bleeding.  No cramping. Denies any abnormal vaginal discharge, fevers, chills, sweats, dysuria, nausea, vomiting, other GI or GU symptoms or other general symptoms.  Pertinent OB/GYN History: Patient's last menstrual period was 08/09/2020. OB History  Gravida Para Term Preterm AB Living  0 0 0 0 0 0  SAB IAB Ectopic Multiple Live Births  0 0 0 0 0  History of PCOS, no STIs, no infections  Patient Active Problem List   Diagnosis Date Noted  . Symptomatic anemia 08/09/2020  . Vaginal bleeding 08/09/2020  . PCOS (polycystic ovarian syndrome) 08/09/2020  . Acute cholecystitis due to biliary calculus 12/17/2019    Past Medical History:  Diagnosis Date  . Family history of adverse reaction to anesthesia   . PCOS (polycystic ovarian syndrome)     Past Surgical History:  Procedure Laterality Date  . CHOLECYSTECTOMY N/A 12/18/2019   Procedure: LAPAROSCOPIC CHOLECYSTECTOMY;  Surgeon: Kinsinger, Arta Bruce, MD;  Location: [place];  Service: General;  Laterality: N/A;  . DILATION AND CURETTAGE OF UTERUS    . WISDOM TOOTH EXTRACTION      History reviewed. No pertinent family history.  Social History:  reports that she has never  smoked. She has never used smokeless tobacco. No history on file for alcohol use and drug use.  Allergies: No Known Allergies  Medications:  I have reviewed the patient's current medications. Prior to Admission:  Medications Prior to Admission  Medication Sig Dispense Refill Last Dose  . Ferrous Sulfate (IRON PO) Take 1 tablet by mouth daily.    08/09/2020 at Unknown time  . SLYND 4 MG TABS Take 1 tablet by mouth at bedtime.   08/08/2020 at Unknown time  . vitamin B-12 (CYANOCOBALAMIN) 1000 MCG tablet Take 2,000 mcg by mouth daily.   08/09/2020 at Unknown time  . ibuprofen (ADVIL) 800 MG tablet Take 1 tablet (800 mg total) by mouth every 8 (eight) hours as needed. (Patient not taking: Reported on 08/09/2020) 30 tablet 0 Completed Course at Unknown time  . oxyCODONE (OXY IR/ROXICODONE) 5 MG immediate release tablet Take 1 tablet (5 mg total) by mouth every 6 (six) hours as needed for severe pain. (Patient not taking: Reported on 08/09/2020) 15 tablet 0 Completed Course at Unknown time   Scheduled: . Drospirenone  1 tablet Oral QHS  . [START ON 08/11/2020] ferrous sulfate  325 mg Oral Q breakfast  . megestrol  80 mg Oral BID  . [START ON 08/11/2020] naproxen  500 mg Oral BID WC  . sodium chloride flush  3 mL Intravenous Q12H   Continuous: . sodium chloride    . sodium chloride     [id number] chloride, acetaminophen **OR** acetaminophen, sodium chloride flush  Review of Systems: Pertinent items noted in HPI and remainder of comprehensive ROS otherwise negative.  Focused Physical Examination: BP (!) 163/87 (BP Location: Left Arm)   Pulse 94   Temp 98.1 F (36.7 C) (Oral)   Resp 15   Ht 5\' 7"  (1.702 m)   Wt 120.9 kg   LMP 08/09/2020   SpO2 100%   BMI 41.74 kg/m  CONSTITUTIONAL: Well-developed, well-nourished female in no acute distress.  HENT:  Normocephalic, atraumatic, External right and left ear normal. Oropharynx is clear and moist EYES: Conjunctivae and EOM are normal. Pupils are equal, round, and reactive to light. No scleral icterus.  NECK: Normal range of motion, supple, no masses.  Normal thyroid.  SKIN: Skin is warm and dry. No rash noted. Not diaphoretic. No erythema. No pallor. [place]: Alert and oriented to person, place, and time. Normal reflexes, muscle tone coordination. No cranial nerve deficit noted. PSYCHIATRIC:  Normal mood and affect. Normal behavior. Normal judgment and thought content. CARDIOVASCULAR: Normal heart rate noted, regular rhythm RESPIRATORY: Clear to auscultation bilaterally. Effort and breath sounds normal, no problems with respiration noted. ABDOMEN: Soft, normal bowel sounds, no distention noted.  No tenderness, rebound or guarding.  PELVIC: Deferred MUSCULOSKELETAL: Normal range of motion. No tenderness.  No cyanosis, clubbing, or edema.  2+ distal pulses.  Labs and Imaging: Results for orders placed or performed during the hospital encounter of 08/09/20 (from the past 72 hour(s))  GC/Chlamydia probe amp     Status: None   Collection Time: 08/09/20  5:38 PM  Result Value Ref Range   Neisseria Gonorrhea Negative    Chlamydia Negative    Comment Normal Reference Ranger Chlamydia - Negative    Comment      Normal Reference Range Neisseria Gonorrhea - Negative  CBC with Differential     Status: Abnormal   Collection Time: 08/09/20  5:40 PM  Result Value Ref Range   WBC 7.1 4.0 - 10.5 K/uL   RBC 2.97 (L)  3.87 - 5.11 MIL/uL   Hemoglobin 5.7 (LL) 12.0 - 15.0 g/dL    Comment: Reticulocyte Hemoglobin testing may be clinically indicated, consider ordering this additional test [id number] THIS CRITICAL RESULT HAS VERIFIED AND BEEN CALLED TO RIMANDO,J. BY SEEL,MOLLY ON 05 05 2022 AT 1920, AND HAS BEEN READ BACK.     HCT 21.6 (L) 36.0 - 46.0 %   MCV 72.7 (L) 80.0 - 100.0 fL   MCH 19.2 (L) 26.0 - 34.0 pg   MCHC 26.4 (L) 30.0 - 36.0 g/dL   RDW 18.7 (H) 11.5 - 15.5 %   Platelets 296 150 - 400 K/uL   nRBC 1.0 (H) 0.0 - 0.2 %   Neutrophils Relative % 73 %   Neutro Abs 5.2 1.7 - 7.7 K/uL   Lymphocytes Relative 20 %   Lymphs Abs 1.4 0.7 - 4.0 K/uL   Monocytes Relative 5 %   Monocytes Absolute 0.4 0.1 - 1.0 K/uL   Eosinophils Relative 1 %   Eosinophils Absolute 0.1 0.0 - 0.5 K/uL   Basophils Relative 0 %   Basophils Absolute 0.0 0.0 - 0.1 K/uL   Immature Granulocytes 1 %   Abs  Immature Granulocytes 0.07 0.00 - 0.07 K/uL    Comment: Performed at [facility], [place] [address] [place], [place] [address]  Basic metabolic panel     Status: Abnormal   Collection Time: 08/09/20  5:40 PM  Result Value Ref Range   Sodium 143 135 - 145 mmol/L   Potassium 3.9 3.5 - 5.1 mmol/L   Chloride 111 98 - 111 mmol/L   CO2 23 22 - 32 mmol/L   Glucose, Bld 145 (H) 70 - 99 mg/dL    Comment: Glucose reference range applies only to samples taken after fasting for at least 8 hours.   BUN 11 6 - 20 mg/dL   Creatinine, Ser 0.71 0.44 - 1.00 mg/dL   Calcium 9.5 8.9 - 10.3 mg/dL   GFR, Estimated >60 >60 mL/min    Comment: (NOTE) Calculated using the CKD-EPI Creatinine Equation (2021)    Anion gap 9 5 - 15    Comment: Performed at [facility], [place] [address] [place], [place] [address]  I-Stat beta hCG blood, ED     Status: None   Collection Time: 08/09/20  5:48 PM  Result Value Ref Range   I-stat hCG, quantitative <5.0 <5 mIU/mL   Comment 3            Comment:   GEST. AGE      CONC.  (mIU/mL)   <=1 WEEK        5 - 50     2 WEEKS       50 - 500     3 WEEKS       100 - 10,000     4 WEEKS     1,000 - 30,000        FEMALE AND NON-PREGNANT FEMALE:     LESS THAN 5 mIU/mL   Type and screen [place]     Status: None (Preliminary result)   Collection Time: 08/09/20  6:03 PM  Result Value Ref Range   ABO/RH(D) O NEG    Antibody Screen NEG    Sample Expiration 08/12/2020,2359    Unit Number [id number]    Blood Component Type RED CELLS,LR    Unit division 00    Status of Unit ISSUED    Transfusion Status OK TO TRANSFUSE  Crossmatch Result Compatible    Unit Number [id number]    Blood Component Type RED CELLS,LR    Unit division 00    Status of Unit ISSUED,FINAL    Transfusion Status OK TO TRANSFUSE    Crossmatch Result      Compatible Performed at [place] [address], [place],  [place] [address]   Prepare RBC (crossmatch)     Status: None   Collection Time: 08/09/20  6:03 PM  Result Value Ref Range   Order Confirmation      ORDER PROCESSED BY BLOOD BANK Performed at [facility], [place] [address] [place], [place] [address]   Urinalysis, Routine w reflex microscopic Urine, Clean Catch     Status: Abnormal   Collection Time: 08/09/20  6:44 PM  Result Value Ref Range   Color, Urine RED (A) YELLOW    Comment: BIOCHEMICALS MAY BE AFFECTED BY COLOR   APPearance CLOUDY (A) CLEAR   Specific Gravity, Urine 1.018 1.005 - 1.030   pH 6.0 5.0 - 8.0   Glucose, UA NEGATIVE NEGATIVE mg/dL   Hgb urine dipstick LARGE (A) NEGATIVE   Bilirubin Urine NEGATIVE NEGATIVE   Ketones, ur NEGATIVE NEGATIVE mg/dL   Protein, ur 100 (A) NEGATIVE mg/dL   Nitrite NEGATIVE NEGATIVE   Leukocytes,Ua SMALL (A) NEGATIVE   RBC / HPF >50 (H) 0 - 5 RBC/hpf   Bacteria, UA NONE SEEN NONE SEEN   Squamous Epithelial / LPF 0-5 0 - 5    Comment: Performed at [facility], [place] [address], [place], [place] [address]  Wet prep, genital     Status: Abnormal   Collection Time: 08/09/20  6:56 PM   Specimen: PATH Cytology Cervicovaginal Ancillary Only  Result Value Ref Range   Yeast Wet Prep HPF POC NONE SEEN NONE SEEN   Trich, Wet Prep NONE SEEN NONE SEEN   Clue Cells Wet Prep HPF POC PRESENT (A) NONE SEEN   WBC, Wet Prep HPF POC NONE SEEN NONE SEEN   Sperm NONE SEEN     Comment: Performed at [facility], [place] [address], [place], [place] [address]  ABO/Rh     Status: None   Collection Time: 08/09/20  8:07 PM  Result Value Ref Range   ABO/RH(D)      Jenetta Downer NEG Performed at [place] [address], [place], [place] [address]   SARS CORONAVIRUS 2 (TAT 6-24 HRS) Nasopharyngeal Nasopharyngeal Swab     Status: None   Collection Time: 08/09/20  8:48 PM   Specimen: Nasopharyngeal Swab  Result Value Ref Range   SARS Coronavirus 2  NEGATIVE NEGATIVE    Comment: (NOTE) SARS-CoV-2 target nucleic acids are NOT DETECTED.  The SARS-CoV-2 RNA is generally detectable in upper and lower respiratory specimens during the acute phase of infection. Negative results do not preclude SARS-CoV-2 infection, do not rule out co-infections with other pathogens, and should not be used as the sole basis for treatment or other patient management decisions. Negative results must be combined with clinical observations, patient history, and epidemiological information. The expected result is Negative.  Fact Sheet for Patients: SugarRoll.be  Fact Sheet for Healthcare Providers: https://www.woods-mathews.com/  This test is not yet approved or cleared by the [place] FDA and  has been authorized for detection and/or diagnosis of SARS-CoV-2 by FDA under an Emergency Use Authorization (EUA). This EUA will remain  in effect (meaning this test can be used) for the  duration of the COVID-19 declaration under Se ction 564(b)(1) of the Act, 21 U.S.C. section 360bbb-3(b)(1), unless the authorization is terminated or revoked sooner.  Performed at [facility], [place] [address], [place], [place] [address]   CBC     Status: Abnormal   Collection Time: 08/10/20  9:15 AM  Result Value Ref Range   WBC 7.3 4.0 - 10.5 K/uL   RBC 3.74 (L) 3.87 - 5.11 MIL/uL   Hemoglobin 8.0 (L) 12.0 - 15.0 g/dL    Comment: Reticulocyte Hemoglobin testing may be clinically indicated, consider ordering this additional test [id number]    HCT 28.6 (L) 36.0 - 46.0 %   MCV 76.5 (L) 80.0 - 100.0 fL   MCH 21.4 (L) 26.0 - 34.0 pg   MCHC 28.0 (L) 30.0 - 36.0 g/dL   RDW 20.9 (H) 11.5 - 15.5 %   Platelets 287 150 - 400 K/uL   nRBC 1.0 (H) 0.0 - 0.2 %    Comment: Performed at [facility], [place] [address] [place], [place] [address]    US PELVIC COMPLETE WITH TRANSVAGINAL  Result Date:  08/09/2020 CLINICAL DATA:  Vaginal bleeding, history of polycystic ovarian syndrome EXAM: TRANSABDOMINAL AND TRANSVAGINAL ULTRASOUND OF PELVIS TECHNIQUE: Both transabdominal and transvaginal ultrasound examinations of the pelvis were performed. Transabdominal technique was performed for global imaging of the pelvis including uterus, ovaries, adnexal regions, and pelvic cul-de-sac. It was necessary to proceed with endovaginal exam following the transabdominal exam to visualize the endometrium and adnexal structures. COMPARISON:  12/17/2019 FINDINGS: Uterus Measurements: 9.2 x 5.8 by 6.5 cm = volume: 180 mL. Degenerating fibroids are seen within the uterine fundus, measuring 2.1 x 2.9 x 2.6 cm and 1.7 x 2.9 x 2.5 cm. Endometrium Thickness: 17 mm.  Endometrium is heterogeneous and thickened. Right ovary Measurements: 4.5 x 2.9 by 2.9 cm = volume: 19.4 ML. There is an exophytic follicle or paraovarian cyst in the right adnexa measuring 2.7 x 2.7 by 2.6 cm. This is simple in appearance without septation or mural thickening. Left ovary Measurements: 2.7 x 4.2 x 3.8 cm = volume: 23 mL. Numerous peripherally oriented follicles are seen, consistent with given history of polycystic ovarian syndrome. Other findings Trace free fluid in the pelvis. IMPRESSION: 1. Heterogeneous thickened endometrium measuring 17 mm. If bleeding remains unresponsive to hormonal or medical therapy, focal lesion work-up with sonohysterogram should be considered. Endometrial biopsy should also be considered in pre-menopausal patients at high risk for endometrial carcinoma. (Ref: Radiological Reasoning: Algorithmic Workup of Abnormal Vaginal Bleeding with Endovaginal Sonography and Sonohysterography. AJR 2008; 468:E32-12) 2. Degenerating uterine fibroids. 3. Multiple follicles in the ovaries consistent with given history of polycystic ovarian syndrome. 4. Simple paraovarian cyst or exophytic follicle off the right ovary. Electronically Signed   By:  Randa Ngo M.D.   On: 08/09/2020 22:24

## 2020-08-10 NOTE — Plan of Care (Signed)

## 2020-08-13 LAB — TYPE AND SCREEN
ABO/RH(D): O NEG
Antibody Screen: NEGATIVE
Unit division: 0
Unit division: 0

## 2020-08-13 LAB — BPAM RBC
Blood Product Expiration Date: 202206052359
Blood Product Expiration Date: 202206052359
ISSUE DATE / TIME: 202205052305
ISSUE DATE / TIME: 202205060141
Unit Type and Rh: 9500
Unit Type and Rh: 9500

## 2020-08-28 ENCOUNTER — Other Ambulatory Visit: Payer: Self-pay

## 2020-08-28 ENCOUNTER — Encounter: Payer: Self-pay | Admitting: Obstetrics

## 2020-08-28 ENCOUNTER — Other Ambulatory Visit (HOSPITAL_COMMUNITY)
Admission: RE | Admit: 2020-08-28 | Discharge: 2020-08-28 | Disposition: A | Discharge status: Home / Self Care | Payer: BC Managed Care – PPO | Source: Ambulatory Visit | Attending: Obstetrics | Admitting: Obstetrics

## 2020-08-28 ENCOUNTER — Ambulatory Visit (INDEPENDENT_AMBULATORY_CARE_PROVIDER_SITE_OTHER): Payer: BC Managed Care – PPO | Admitting: Obstetrics

## 2020-08-28 VITALS — BP 137/80 | HR 92 | Ht 67.0 in | Wt 268.5 lb

## 2020-08-28 DIAGNOSIS — Z01411 Encounter for gynecological examination (general) (routine) with abnormal findings: Secondary | ICD-10-CM

## 2020-08-28 DIAGNOSIS — N939 Abnormal uterine and vaginal bleeding, unspecified: Secondary | ICD-10-CM | POA: Diagnosis not present

## 2020-08-28 DIAGNOSIS — E282 Polycystic ovarian syndrome: Secondary | ICD-10-CM

## 2020-08-28 DIAGNOSIS — Z01419 Encounter for gynecological examination (general) (routine) without abnormal findings: Secondary | ICD-10-CM | POA: Diagnosis not present

## 2020-08-28 DIAGNOSIS — D500 Iron deficiency anemia secondary to blood loss (chronic): Secondary | ICD-10-CM | POA: Diagnosis not present

## 2020-08-28 DIAGNOSIS — D251 Intramural leiomyoma of uterus: Secondary | ICD-10-CM | POA: Diagnosis not present

## 2020-08-28 DIAGNOSIS — Z6841 Body Mass Index (BMI) 40.0 and over, adult: Secondary | ICD-10-CM

## 2020-08-28 MED ORDER — MEGESTROL ACETATE 40 MG PO TABS: 80.0000 mg | ORAL_TABLET | Freq: Two times a day (BID) | ORAL | 0 refills | Status: DC

## 2020-08-28 MED ORDER — METFORMIN HCL ER 500 MG PO TB24: 1500.0000 mg | ORAL_TABLET | Freq: Every day | ORAL | 11 refills | Status: DC

## 2020-08-28 MED ORDER — PRENATAL PLUS 27-1 MG PO TABS: 1.0000 | ORAL_TABLET | Freq: Every day | ORAL | 11 refills | Status: DC

## 2020-08-28 NOTE — Progress Notes (Signed)
Patient presents as a New patient for irregular bleeding. Patient states that she started bleeding heavy at the end of March. Bleeding lasted through the beginning of May. She is currently taking Megace.   Patient states that last pap was over a year and was normal that she recalls.

## 2020-08-28 NOTE — Progress Notes (Signed)
Subjective:        Eileen Todd is a 35 y.o. female here for a routine exam.  Current complaints:  Presents in follow up from admission for a history of heavy and prolonged period for 2 months prior to presentation to ER on 08/09/20.  Evaluated in ER for AUB and her Hgb was 5.7.  She was admitted, started on Megace and transfused 2 units PRBC's.  Hgb on 08/10/20 discharge was 8.  The patient has a long history of AUB.  She is nulligravid, and she and her husband would like to conceive.  Her past gyn history is significant for obesity, PCOS and AUB.  She had a D&C done with removal of polyps a few years ago.  Had severe anemia in November 2021 and was transfused.  Was placed on Slynd after that transfusion and did well.  Stopped taking Slynd in January because she and her husband wanted to try and conceive.  Started having heavy vaginal bleeding again in March, and continued until her presentation to the ER 2 months later.  Ultrasound during her admission revealed a thickened endometrium ( 17 mm ), a few small degenerating fundal intramural fibroids, and numerous peripherally oriented ovarian follicles c/w PCOS.  Today, she is having no bleeding or other complaints.  Taking Megace 80 mg bid , B12 2000 mcg daily,and iron 325 mg daily.  Personal health questionnaire:  Is patient Ashkenazi Jewish, have a family history of breast and/or ovarian cancer: no Is there a family history of uterine cancer diagnosed at age < 41, gastrointestinal cancer, urinary tract cancer, family member who is a Field seismologist syndrome-associated carrier: no Is the patient overweight and hypertensive, family history of diabetes, personal history of gestational diabetes, preeclampsia or PCOS: no Is patient over 89, have PCOS,  family history of premature CHD under age 80, diabetes, smoke, have hypertension or peripheral artery disease:  no At any time, has a partner hit, kicked or otherwise hurt or frightened you?: no Over the past 2  weeks, have you felt down, depressed or hopeless?: no Over the past 2 weeks, have you felt little interest or pleasure in doing things?:no   Gynecologic History Patient's last menstrual period was 08/09/2020. Contraception: none.  Taking Megace for AUB Last Pap: April 2021. Results were: normal Last mammogram: n/a. Results were: n/a  Obstetric History OB History  Gravida Para Term Preterm AB Living  0 0 0 0 0 0  SAB IAB Ectopic Multiple Live Births  0 0 0 0 0    Past Medical History:  Diagnosis Date  . Family history of adverse reaction to anesthesia   . PCOS (polycystic ovarian syndrome)     Past Surgical History:  Procedure Laterality Date  . CHOLECYSTECTOMY N/A 12/18/2019   Procedure: LAPAROSCOPIC CHOLECYSTECTOMY;  Surgeon: Kinsinger, Arta Bruce, MD;  Location: [place];  Service: General;  Laterality: N/A;  . DILATION AND CURETTAGE OF UTERUS    . WISDOM TOOTH EXTRACTION       Current Outpatient Medications:  .  ferrous sulfate 325 (65 FE) MG tablet, Take 1 tablet (325 mg total) by mouth daily with breakfast., Disp: 30 tablet, Rfl: 0 .  metFORMIN (GLUCOPHAGE XR) 500 MG 24 hr tablet, Take 3 tablets (1,500 mg total) by mouth daily after supper., Disp: 90 tablet, Rfl: 11 .  naproxen (NAPROSYN) 500 MG tablet, Take 1 tablet (500 mg total) by mouth 2 (two) times daily with a meal., Disp: 14 tablet, Rfl: 0 .  prenatal vitamin w/FE, FA (PRENATAL 1 + 1) 27-1 MG TABS tablet, Take 1 tablet by mouth daily before breakfast., Disp: 30 tablet, Rfl: 11 .  vitamin B-12 (CYANOCOBALAMIN) 1000 MCG tablet, Take 2,000 mcg by mouth daily., Disp: , Rfl:  .  megestrol (MEGACE) 40 MG tablet, Take 2 tablets (80 mg total) by mouth 2 (two) times daily., Disp: 120 tablet, Rfl: 0 No Known Allergies  Social History   Tobacco Use  . Smoking status: Never Smoker  . Smokeless tobacco: Never Used  Substance Use Topics  . Alcohol use: Not Currently    History reviewed. No pertinent family  history.  ULTRASOUND:   US PELVIC COMPLETE WITH TRANSVAGINAL (Accession [id number]) (Order [id number]) Imaging Date: 08/09/2020 Department: [facility] Released By: Tracie Harrier, RN (auto-released) Authorizing: Chotiner, Yevonne Aline, MD    Exam Status  Status  Final [99]   PACS Intelerad Image Link  Show images for US PELVIC COMPLETE WITH TRANSVAGINAL  Study Result  Narrative & Impression  CLINICAL DATA:  Vaginal bleeding, history of polycystic ovarian syndrome  EXAM: TRANSABDOMINAL AND TRANSVAGINAL ULTRASOUND OF PELVIS  TECHNIQUE: Both transabdominal and transvaginal ultrasound examinations of the pelvis were performed. Transabdominal technique was performed for global imaging of the pelvis including uterus, ovaries, adnexal regions, and pelvic cul-de-sac. It was necessary to proceed with endovaginal exam following the transabdominal exam to visualize the endometrium and adnexal structures.  COMPARISON:  12/17/2019  FINDINGS: Uterus  Measurements: 9.2 x 5.8 by 6.5 cm = volume: 180 mL. Degenerating fibroids are seen within the uterine fundus, measuring 2.1 x 2.9 x 2.6 cm and 1.7 x 2.9 x 2.5 cm.  Endometrium  Thickness: 17 mm.  Endometrium is heterogeneous and thickened.  Right ovary  Measurements: 4.5 x 2.9 by 2.9 cm = volume: 19.4 ML. There is an exophytic follicle or paraovarian cyst in the right adnexa measuring 2.7 x 2.7 by 2.6 cm. This is simple in appearance without septation or mural thickening.  Left ovary  Measurements: 2.7 x 4.2 x 3.8 cm = volume: 23 mL. Numerous peripherally oriented follicles are seen, consistent with given history of polycystic ovarian syndrome.  Other findings  Trace free fluid in the pelvis.  IMPRESSION: 1. Heterogeneous thickened endometrium measuring 17 mm. If bleeding remains unresponsive to hormonal or medical therapy, focal lesion work-up with sonohysterogram should be considered.  Endometrial biopsy should also be considered in pre-menopausal patients at high risk for endometrial carcinoma. (Ref: Radiological Reasoning: Algorithmic Workup of Abnormal Vaginal Bleeding with Endovaginal Sonography and Sonohysterography. AJR 2008; 376:E83-15) 2. Degenerating uterine fibroids. 3. Multiple follicles in the ovaries consistent with given history of polycystic ovarian syndrome. 4. Simple paraovarian cyst or exophytic follicle off the right ovary.   Electronically Signed   By: Randa Ngo M.D.   On: 08/09/2020 22:24    Results for MARVINE, ENCALADE (MRN [id number]) as of 08/28/2020 11:20  Ref. Range 08/10/2020 09:15  WBC Latest Ref Range: 4.0 - 10.5 K/uL 7.3  RBC Latest Ref Range: 3.87 - 5.11 MIL/uL 3.74 (L)  Hemoglobin Latest Ref Range: 12.0 - 15.0 g/dL 8.0 (L)  HCT Latest Ref Range: 36.0 - 46.0 % 28.6 (L)  MCV Latest Ref Range: 80.0 - 100.0 fL 76.5 (L)  MCH Latest Ref Range: 26.0 - 34.0 pg 21.4 (L)  MCHC Latest Ref Range: 30.0 - 36.0 g/dL 28.0 (L)  RDW Latest Ref Range: 11.5 - 15.5 % 20.9 (H)  Platelets Latest Ref Range: 150 - 400 K/uL 287  nRBC Latest Ref Range: 0.0 - 0.2 % 1.0 (H)     Results for MOHOGANY, TOPPINS (MRN [id number]) as of 08/28/2020 11:20  Ref. Range 08/09/2020 18:56  Yeast Wet Prep HPF POC Latest Ref Range: NONE SEEN  NONE SEEN  Trich, Wet Prep Latest Ref Range: NONE SEEN  NONE SEEN  Clue Cells Wet Prep HPF POC Latest Ref Range: NONE SEEN  PRESENT (A)  WBC, Wet Prep HPF POC Latest Ref Range: NONE SEEN  NONE SEEN  WET PREP, GENITAL Unknown Rpt (A)   Review of Systems  Constitutional: negative for fatigue and weight loss Respiratory: negative for cough and wheezing Cardiovascular: negative for chest pain, fatigue and palpitations Gastrointestinal: negative for abdominal pain and change in bowel habits Musculoskeletal:negative for myalgias Neurological: negative for gait problems and tremors Behavioral/Psych: negative for abusive relationship,  depression Endocrine: negative for temperature intolerance    Genitourinary: positive for abnormal menstrual periods.  Negative for genital lesions, hot flashes, sexual problems and vaginal discharge Integument/breast: negative for breast lump, breast tenderness, nipple discharge and skin lesion(s)    Objective:       BP 137/80   Pulse 92   Ht 5\' 7"  (1.702 m)   Wt 268 lb 8 oz (121.8 kg)   LMP 08/09/2020   BMI 42.05 kg/m  General:   alert and no distress  Skin:   no rash or abnormalities  Lungs:   clear to auscultation bilaterally  Heart:   regular rate and rhythm, S1, S2 normal, no murmur, click, rub or gallop  Breasts:   normal without suspicious masses, skin or nipple changes or axillary nodes  Abdomen:  normal findings: no organomegaly, soft, non-tender and no hernia  Pelvis:  External genitalia: normal general appearance Urinary system: urethral meatus normal and bladder without fullness, nontender Vaginal: normal without tenderness, induration or masses Cervix: normal appearance Adnexa: normal bimanual exam Uterus: anteverted and non-tender, normal size   Lab Review Urine pregnancy test Labs reviewed yes Radiologic studies reviewed yes  I have spent a total of 25 minutes of face-to-face time, excluding clinical staff time, reviewing notes and preparing to see patient, ordering tests and/or medications, and counseling the patient.  Assessment:     1. Encounter for routine gynecological examination with Papanicolaou smear of cervix Rx: - Cytology - PAP( [facility])  2. Abnormal uterine bleeding (AUB) Rx: - megestrol (MEGACE) 40 MG tablet; Take 2 tablets (80 mg total) by mouth 2 (two) times daily.  Dispense: 120 tablet; Refill: 0  3. Fibroids, intramural, small  4. Iron deficiency anemia due to chronic blood loss Rx: - prenatal vitamin w/FE, FA (PRENATAL 1 + 1) 27-1 MG TABS tablet; Take 1 tablet by mouth daily before breakfast.  Dispense: 30 tablet; Refill: 11 -  check CBC and ferritin next visit in 6 weeks  5. PCOS (polycystic ovarian syndrome) Rx: - metFORMIN (GLUCOPHAGE XR) 500 MG 24 hr tablet; Take 3 tablets (1,500 mg total) by mouth daily after supper.  Dispense: 90 tablet; Refill: 11 - check HgbA1c next visit in 6 weeks  6. Class 3 severe obesity without serious comorbidity with body mass index (BMI) of 40.0 to 44.9 in adult, unspecified obesity type ([place]) - program of caloric reduction, exercise and behavioral modification recommended     Plan:    Education reviewed: calcium supplements, depression evaluation, low fat, low cholesterol diet, safe sex/STD prevention, self breast exams and weight bearing exercise. Contraception: wants to conceive. Follow up in: 6 weeks.  Meds ordered this encounter  Medications  . megestrol (MEGACE) 40 MG tablet    Sig: Take 2 tablets (80 mg total) by mouth 2 (two) times daily.    Dispense:  120 tablet    Refill:  0  . metFORMIN (GLUCOPHAGE XR) 500 MG 24 hr tablet    Sig: Take 3 tablets (1,500 mg total) by mouth daily after supper.    Dispense:  90 tablet    Refill:  11  . prenatal vitamin w/FE, FA (PRENATAL 1 + 1) 27-1 MG TABS tablet    Sig: Take 1 tablet by mouth daily before breakfast.    Dispense:  30 tablet    Refill:  11    Shelly Bombard, MD 08/28/2020 10:14 AM

## 2020-08-30 DIAGNOSIS — F321 Major depressive disorder, single episode, moderate: Secondary | ICD-10-CM | POA: Diagnosis not present

## 2020-08-30 DIAGNOSIS — F411 Generalized anxiety disorder: Secondary | ICD-10-CM | POA: Diagnosis not present

## 2020-08-30 LAB — CYTOLOGY - PAP
Comment: NEGATIVE
Diagnosis: NEGATIVE
High risk HPV: NEGATIVE

## 2020-09-13 DIAGNOSIS — F321 Major depressive disorder, single episode, moderate: Secondary | ICD-10-CM | POA: Diagnosis not present

## 2020-09-13 DIAGNOSIS — F411 Generalized anxiety disorder: Secondary | ICD-10-CM | POA: Diagnosis not present

## 2020-09-26 ENCOUNTER — Other Ambulatory Visit: Payer: Self-pay | Admitting: Obstetrics

## 2020-09-26 DIAGNOSIS — N939 Abnormal uterine and vaginal bleeding, unspecified: Secondary | ICD-10-CM

## 2020-09-27 DIAGNOSIS — F411 Generalized anxiety disorder: Secondary | ICD-10-CM | POA: Diagnosis not present

## 2020-09-27 DIAGNOSIS — F321 Major depressive disorder, single episode, moderate: Secondary | ICD-10-CM | POA: Diagnosis not present

## 2020-10-09 ENCOUNTER — Other Ambulatory Visit: Payer: Self-pay

## 2020-10-09 ENCOUNTER — Ambulatory Visit (INDEPENDENT_AMBULATORY_CARE_PROVIDER_SITE_OTHER): Payer: BC Managed Care – PPO | Admitting: Obstetrics

## 2020-10-09 ENCOUNTER — Encounter: Payer: Self-pay | Admitting: Obstetrics

## 2020-10-09 VITALS — BP 122/82 | HR 84 | Ht 67.0 in | Wt 269.4 lb

## 2020-10-09 DIAGNOSIS — R9389 Abnormal findings on diagnostic imaging of other specified body structures: Secondary | ICD-10-CM

## 2020-10-09 DIAGNOSIS — Z3009 Encounter for other general counseling and advice on contraception: Secondary | ICD-10-CM

## 2020-10-09 DIAGNOSIS — D500 Iron deficiency anemia secondary to blood loss (chronic): Secondary | ICD-10-CM

## 2020-10-09 DIAGNOSIS — E282 Polycystic ovarian syndrome: Secondary | ICD-10-CM | POA: Diagnosis not present

## 2020-10-09 DIAGNOSIS — N939 Abnormal uterine and vaginal bleeding, unspecified: Secondary | ICD-10-CM

## 2020-10-09 DIAGNOSIS — Z30011 Encounter for initial prescription of contraceptive pills: Secondary | ICD-10-CM

## 2020-10-09 DIAGNOSIS — Z6841 Body Mass Index (BMI) 40.0 and over, adult: Secondary | ICD-10-CM

## 2020-10-09 MED ORDER — FERROUS SULFATE 325 (65 FE) MG PO TABS: 325.0000 mg | ORAL_TABLET | Freq: Every day | ORAL | 0 refills | Status: DC

## 2020-10-09 MED ORDER — DROSPIRENONE-ETHINYL ESTRADIOL 3-0.02 MG PO TABS: 1.0000 | ORAL_TABLET | Freq: Every day | ORAL | 11 refills | Status: DC

## 2020-10-09 NOTE — Progress Notes (Signed)
  Patient in clinic for follow up of irregular menstrual bleeding. Patient reported that megace helped with irregular bleeding. Last actively bleeding was early May.  Derl Barrow, RN

## 2020-10-09 NOTE — Progress Notes (Signed)
Patient ID: Eileen Todd, female   DOB: 10/24/1985, 35 y.o.   MRN: [id number]  Chief Complaint  Patient presents with   Menstrual Problem    HPI Eileen Todd is a 35 y.o. female.  History of severe anemia from heavy periods.  Started on Megace, iron / vitamins.  Presents for follow up today.  No further AUB.  Ultrasound revealed PCOS and a thickened endometrial stripe.  Metformin also started for PCOS. HPI  Past Medical History:  Diagnosis Date   Family history of adverse reaction to anesthesia    PCOS (polycystic ovarian syndrome)     Past Surgical History:  Procedure Laterality Date   CHOLECYSTECTOMY N/A 12/18/2019   Procedure: LAPAROSCOPIC CHOLECYSTECTOMY;  Surgeon: Kinsinger, Arta Bruce, MD;  Location: [place];  Service: General;  Laterality: N/A;   DILATION AND CURETTAGE OF UTERUS     WISDOM TOOTH EXTRACTION      History reviewed. No pertinent family history.  Social History Social History   Tobacco Use   Smoking status: Never   Smokeless tobacco: Never  Vaping Use   Vaping Use: Never used  Substance Use Topics   Alcohol use: Not Currently   Drug use: Not Currently    No Known Allergies  Current Outpatient Medications  Medication Sig Dispense Refill   drospirenone-ethinyl estradiol (YAZ) 3-0.02 MG tablet Take 1 tablet by mouth daily. 28 tablet 11   metFORMIN (GLUCOPHAGE XR) 500 MG 24 hr tablet Take 3 tablets (1,500 mg total) by mouth daily after supper. 90 tablet 11   naproxen (NAPROSYN) 500 MG tablet Take 1 tablet (500 mg total) by mouth 2 (two) times daily with a meal. 14 tablet 0   prenatal vitamin w/FE, FA (PRENATAL 1 + 1) 27-1 MG TABS tablet Take 1 tablet by mouth daily before breakfast. 30 tablet 11   vitamin B-12 (CYANOCOBALAMIN) 1000 MCG tablet Take 2,000 mcg by mouth daily.     ferrous sulfate 325 (65 FE) MG tablet Take 1 tablet (325 mg total) by mouth daily with breakfast. 30 tablet 0   No current facility-administered medications for this visit.     Review of Systems Review of Systems Constitutional: negative for fatigue and weight loss Respiratory: negative for cough and wheezing Cardiovascular: negative for chest pain, fatigue and palpitations Gastrointestinal: negative for abdominal pain and change in bowel habits Genitourinary: positive for AUB Integument/breast: negative for nipple discharge Musculoskeletal:negative for myalgias Neurological: negative for gait problems and tremors Behavioral/Psych: negative for abusive relationship, depression Endocrine: negative for temperature intolerance      Blood pressure 122/82, pulse 84, height 5\' 7"  (1.702 m), weight 269 lb 6.4 oz (122.2 kg).  Physical Exam Physical Exam General:   Alert and no distress  Skin:   no rash or abnormalities  Lungs:   clear to auscultation bilaterally  Heart:   regular rate and rhythm, S1, S2 normal, no murmur, click, rub or gallop  Breasts:   normal without suspicious masses, skin or nipple changes or axillary nodes  Abdomen:  normal findings: no organomegaly, soft, non-tender and no hernia  Pelvis:  External genitalia: normal general appearance Urinary system: urethral meatus normal and bladder without fullness, nontender Vaginal: normal without tenderness, induration or masses Cervix: normal appearance Adnexa: normal bimanual exam Uterus: anteverted and non-tender, normal size    I have spent a total of 20 minutes of face-to-face time, excluding clinical staff time, reviewing notes and preparing to see patient, ordering tests and/or medications, and counseling the patient.  Data Reviewed Wet Prep  Assessment     1. Abnormal uterine bleeding (AUB) Rx: - ferrous sulfate 325 (65 FE) MG tablet; Take 1 tablet (325 mg total) by mouth daily with breakfast.  Dispense: 30 tablet; Refill: 0  2. Iron deficiency anemia due to chronic blood loss Rx: - ferrous sulfate 325 (65 FE) MG tablet; Take 1 tablet (325 mg total) by mouth daily with  breakfast.  Dispense: 30 tablet; Refill: 0 - CBC; Future - Ferritin; Future - Ferritin - CBC  3. Class 3 severe obesity due to excess calories without serious comorbidity with body mass index (BMI) of 40.0 to 44.9 in adult ([facility]) - weight loss with the aid of dietary changes and behavioral modification recommended   4. PCOS (polycystic ovarian syndrome) - part of AUB etiology - needs OCP's and Metformin  5. Endometrial thickening on ultrasound Rx: - US PELVIC COMPLETE WITH TRANSVAGINAL; Future  6. Encounter for counseling regarding contraception - options discussed.  All questions answered. - OCP's recommended for PCOS and agreed to  7. Encounter for initial prescription of contraceptive pills Rx: - drospirenone-ethinyl estradiol (YAZ) 3-0.02 MG tablet; Take 1 tablet by mouth daily.  Dispense: 28 tablet; Refill: 11     Plan   Follow up in 3 months  Orders Placed This Encounter  Procedures   US PELVIC COMPLETE WITH TRANSVAGINAL    Standing Status:   Future    Standing Expiration Date:   10/09/2021    Order Specific Question:   Reason for Exam (SYMPTOM  OR DIAGNOSIS REQUIRED)    Answer:   Endomrtrial thickening.  Follow up.    Order Specific Question:   Preferred imaging location?    Answer:   [facility]   CBC    Standing Status:   Future    Standing Expiration Date:   01/09/2021   Ferritin    Standing Status:   Future    Standing Expiration Date:   01/09/2021   Meds ordered this encounter  Medications   drospirenone-ethinyl estradiol (YAZ) 3-0.02 MG tablet    Sig: Take 1 tablet by mouth daily.    Dispense:  28 tablet    Refill:  11   ferrous sulfate 325 (65 FE) MG tablet    Sig: Take 1 tablet (325 mg total) by mouth daily with breakfast.    Dispense:  30 tablet    Refill:  0      Shelly Bombard, MD 10/09/2020 9:25 AM

## 2020-10-10 ENCOUNTER — Other Ambulatory Visit: Payer: Self-pay | Admitting: Obstetrics

## 2020-10-10 LAB — CBC
Hematocrit: 35.7 % (ref 34.0–46.6)
Hemoglobin: 10.8 g/dL — ABNORMAL LOW (ref 11.1–15.9)
MCH: 23.7 pg — ABNORMAL LOW (ref 26.6–33.0)
MCHC: 30.3 g/dL — ABNORMAL LOW (ref 31.5–35.7)
MCV: 78 fL — ABNORMAL LOW (ref 79–97)
Platelets: 308 10*3/uL (ref 150–450)
RBC: 4.56 x10E6/uL (ref 3.77–5.28)
RDW: 16.8 % — ABNORMAL HIGH (ref 11.7–15.4)
WBC: 7.7 10*3/uL (ref 3.4–10.8)

## 2020-10-10 LAB — FERRITIN: Ferritin: 18 ng/mL (ref 15–150)

## 2020-10-11 DIAGNOSIS — F321 Major depressive disorder, single episode, moderate: Secondary | ICD-10-CM | POA: Diagnosis not present

## 2020-10-11 DIAGNOSIS — F411 Generalized anxiety disorder: Secondary | ICD-10-CM | POA: Diagnosis not present

## 2020-10-12 ENCOUNTER — Other Ambulatory Visit: Payer: Self-pay

## 2020-10-12 ENCOUNTER — Ambulatory Visit (HOSPITAL_BASED_OUTPATIENT_CLINIC_OR_DEPARTMENT_OTHER)
Admission: RE | Admit: 2020-10-12 | Discharge: 2020-10-12 | Disposition: A | Discharge status: Home / Self Care | Payer: BC Managed Care – PPO | Source: Ambulatory Visit | Attending: Obstetrics | Admitting: Obstetrics

## 2020-10-12 DIAGNOSIS — N8500 Endometrial hyperplasia, unspecified: Secondary | ICD-10-CM | POA: Diagnosis not present

## 2020-10-12 DIAGNOSIS — D252 Subserosal leiomyoma of uterus: Secondary | ICD-10-CM | POA: Diagnosis not present

## 2020-10-12 DIAGNOSIS — N83291 Other ovarian cyst, right side: Secondary | ICD-10-CM | POA: Diagnosis not present

## 2020-10-12 DIAGNOSIS — D251 Intramural leiomyoma of uterus: Secondary | ICD-10-CM | POA: Diagnosis not present

## 2020-10-12 DIAGNOSIS — R9389 Abnormal findings on diagnostic imaging of other specified body structures: Secondary | ICD-10-CM | POA: Insufficient documentation

## 2020-10-15 ENCOUNTER — Telehealth: Payer: Self-pay

## 2020-10-15 NOTE — Telephone Encounter (Signed)
-----   Message from Shelly Bombard, MD sent at 10/15/2020 12:39 PM EDT ----- Patient has persistent endometrial thickening after 2 months of Megace,.and also a possible endometrial polyp.  Please schedule sonohysterography on menstrual cycle day 9 after next period.

## 2020-10-15 NOTE — Telephone Encounter (Signed)
Patient has been notified of test results.She will call us when she starts her cycle.

## 2020-10-25 DIAGNOSIS — F321 Major depressive disorder, single episode, moderate: Secondary | ICD-10-CM | POA: Diagnosis not present

## 2020-10-25 DIAGNOSIS — F411 Generalized anxiety disorder: Secondary | ICD-10-CM | POA: Diagnosis not present

## 2020-11-02 ENCOUNTER — Other Ambulatory Visit: Payer: Self-pay | Admitting: Obstetrics

## 2020-11-02 DIAGNOSIS — N939 Abnormal uterine and vaginal bleeding, unspecified: Secondary | ICD-10-CM

## 2020-11-02 DIAGNOSIS — D500 Iron deficiency anemia secondary to blood loss (chronic): Secondary | ICD-10-CM

## 2020-11-08 DIAGNOSIS — F321 Major depressive disorder, single episode, moderate: Secondary | ICD-10-CM | POA: Diagnosis not present

## 2020-11-08 DIAGNOSIS — F411 Generalized anxiety disorder: Secondary | ICD-10-CM | POA: Diagnosis not present

## 2020-11-12 ENCOUNTER — Other Ambulatory Visit: Payer: Self-pay | Admitting: Obstetrics

## 2020-11-12 DIAGNOSIS — R9389 Abnormal findings on diagnostic imaging of other specified body structures: Secondary | ICD-10-CM

## 2020-11-15 ENCOUNTER — Other Ambulatory Visit: Payer: Self-pay | Admitting: Obstetrics

## 2020-11-15 ENCOUNTER — Other Ambulatory Visit: Payer: BC Managed Care – PPO

## 2020-11-15 ENCOUNTER — Ambulatory Visit
Admission: RE | Admit: 2020-11-15 | Discharge: 2020-11-15 | Disposition: A | Discharge status: Home / Self Care | Payer: BC Managed Care – PPO | Source: Ambulatory Visit | Attending: Obstetrics | Admitting: Obstetrics

## 2020-11-15 DIAGNOSIS — N939 Abnormal uterine and vaginal bleeding, unspecified: Secondary | ICD-10-CM | POA: Diagnosis not present

## 2020-11-15 DIAGNOSIS — R9389 Abnormal findings on diagnostic imaging of other specified body structures: Secondary | ICD-10-CM

## 2020-11-15 DIAGNOSIS — D259 Leiomyoma of uterus, unspecified: Secondary | ICD-10-CM | POA: Diagnosis not present

## 2020-11-19 ENCOUNTER — Other Ambulatory Visit (HOSPITAL_COMMUNITY)
Admission: RE | Admit: 2020-11-19 | Discharge: 2020-11-19 | Disposition: A | Discharge status: Home / Self Care | Payer: BC Managed Care – PPO | Source: Ambulatory Visit | Attending: Obstetrics | Admitting: Obstetrics

## 2020-11-19 ENCOUNTER — Ambulatory Visit (INDEPENDENT_AMBULATORY_CARE_PROVIDER_SITE_OTHER): Payer: BC Managed Care – PPO | Admitting: Obstetrics

## 2020-11-19 ENCOUNTER — Encounter: Payer: Self-pay | Admitting: Obstetrics

## 2020-11-19 ENCOUNTER — Other Ambulatory Visit: Payer: Self-pay

## 2020-11-19 VITALS — BP 156/98 | HR 102 | Ht 67.0 in | Wt 265.0 lb

## 2020-11-19 DIAGNOSIS — R9389 Abnormal findings on diagnostic imaging of other specified body structures: Secondary | ICD-10-CM

## 2020-11-19 DIAGNOSIS — D252 Subserosal leiomyoma of uterus: Secondary | ICD-10-CM

## 2020-11-19 DIAGNOSIS — N939 Abnormal uterine and vaginal bleeding, unspecified: Secondary | ICD-10-CM

## 2020-11-19 DIAGNOSIS — E282 Polycystic ovarian syndrome: Secondary | ICD-10-CM | POA: Diagnosis not present

## 2020-11-19 DIAGNOSIS — N946 Dysmenorrhea, unspecified: Secondary | ICD-10-CM

## 2020-11-19 DIAGNOSIS — N839 Noninflammatory disorder of ovary, fallopian tube and broad ligament, unspecified: Secondary | ICD-10-CM

## 2020-11-19 DIAGNOSIS — Z6841 Body Mass Index (BMI) 40.0 and over, adult: Secondary | ICD-10-CM

## 2020-11-19 MED ORDER — NORETHINDRONE ACETATE 5 MG PO TABS: 10.0000 mg | ORAL_TABLET | Freq: Every day | ORAL | 2 refills | Status: DC

## 2020-11-19 MED ORDER — IBUPROFEN 800 MG PO TABS: 800.0000 mg | ORAL_TABLET | Freq: Three times a day (TID) | ORAL | 5 refills | Status: AC | PRN

## 2020-11-19 NOTE — Addendum Note (Signed)
Addended by: Huey Bienenstock on: 11/19/2020 04:49 PM   Modules accepted: Orders

## 2020-11-19 NOTE — Progress Notes (Signed)
Patient ID: Eileen Todd, female   DOB: 06-Sep-1985, 35 y.o.   MRN: [id number]  No chief complaint on file.   HPI Eileen Todd is a 35 y.o. female.  Complains of irregular, heavy and painful periods.  Has a history of degenerating uterine fibroids and  PCOS.  Has been trying to conceive for the past several years without success.  Current taking Metformin and Yaz for PCOS, but still having irregular, heavy and painful periods.   HPI  Past Medical History:  Diagnosis Date   Family history of adverse reaction to anesthesia    PCOS (polycystic ovarian syndrome)     Past Surgical History:  Procedure Laterality Date   CHOLECYSTECTOMY N/A 12/18/2019   Procedure: LAPAROSCOPIC CHOLECYSTECTOMY;  Surgeon: Kinsinger, Arta Bruce, MD;  Location: [place];  Service: General;  Laterality: N/A;   DILATION AND CURETTAGE OF UTERUS     WISDOM TOOTH EXTRACTION      History reviewed. No pertinent family history.  Social History Social History   Tobacco Use   Smoking status: Never   Smokeless tobacco: Never  Vaping Use   Vaping Use: Never used  Substance Use Topics   Alcohol use: Not Currently   Drug use: Not Currently    No Known Allergies  Current Outpatient Medications  Medication Sig Dispense Refill   ferrous sulfate 325 (65 FE) MG tablet Take 1 tablet (325 mg total) by mouth daily with breakfast. 30 tablet 0   ibuprofen (ADVIL) 800 MG tablet Take 1 tablet (800 mg total) by mouth every 8 (eight) hours as needed. 30 tablet 5   metFORMIN (GLUCOPHAGE XR) 500 MG 24 hr tablet Take 3 tablets (1,500 mg total) by mouth daily after supper. 90 tablet 11   norethindrone (AYGESTIN) 5 MG tablet Take 2 tablets (10 mg total) by mouth daily. 60 tablet 2   prenatal vitamin w/FE, FA (PRENATAL 1 + 1) 27-1 MG TABS tablet Take 1 tablet by mouth daily before breakfast. 30 tablet 11   vitamin B-12 (CYANOCOBALAMIN) 1000 MCG tablet Take 2,000 mcg by mouth daily.     No current facility-administered medications  for this visit.    Review of Systems Review of Systems Constitutional: negative for fatigue and weight loss Respiratory: negative for cough and wheezing Cardiovascular: negative for chest pain, fatigue and palpitations Gastrointestinal: negative for abdominal pain and change in bowel habits Genitourinary: positive for irregular, heavy and painful periods Integument/breast: negative for nipple discharge Musculoskeletal:negative for myalgias Neurological: negative for gait problems and tremors Behavioral/Psych: negative for abusive relationship, depression Endocrine: negative for temperature intolerance      Blood pressure (!) 156/98, pulse (!) 102, height '5\' 7"'$  (1.702 m), weight 265 lb (120.2 kg).  Physical Exam Physical Exam General:   Alert and no distress  Skin:   no rash or abnormalities  Lungs:   clear to auscultation bilaterally  Heart:   regular rate and rhythm, S1, S2 normal, no murmur, click, rub or gallop  Breasts:   normal without suspicious masses, skin or nipple changes or axillary nodes  Abdomen:  normal findings: no organomegaly, soft, non-tender and no hernia  Pelvis:  External genitalia: normal general appearance Urinary system: urethral meatus normal and bladder without fullness, nontender Vaginal: normal without tenderness, induration or masses Cervix: normal appearance Adnexa: normal bimanual exam Uterus: anteverted and very tender, normal size    I have spent a total of 20 minutes of face-to-face time, excluding clinical staff time, reviewing notes and preparing to see  patient, ordering tests and/or medications, and counseling the patient.   Data Reviewed Labs Ultrasound  Assessment     1. Abnormal uterine bleeding (AUB) Rx: - norethindrone (AYGESTIN) 5 MG tablet; Take 2 tablets (10 mg total) by mouth daily.  Dispense: 60 tablet; Refill: 2 - discontinue Yaz  2. Fibroids, subserous, degenerating - Ibuprofen prn  3. Endometrial thickening on  ultrasound - has been on Megace with some thinning from 24 mm tom 11 mm  4. PCOS (polycystic ovarian syndrome) - continue Metformin - will start back on OCP's once AUB is stabilized  5. Disorder of ovulation Rx: - Ambulatory referral to Endocrinology  6. Dysmenorrhea Rx: - ibuprofen (ADVIL) 800 MG tablet; Take 1 tablet (800 mg total) by mouth every 8 (eight) hours as needed.  Dispense: 30 tablet; Refill: 5  7. Class 3 severe obesity due to excess calories without serious comorbidity with body mass index (BMI) of 40.0 to 44.9 in adult [facility])      Plan   Follow up in 3 months  Orders Placed This Encounter  Procedures   Ambulatory referral to Endocrinology    Referral Priority:   Routine    Referral Type:   Consultation    Referral Reason:   Specialty Services Required    Number of Visits Requested:   1    Shelly Bombard, MD 11/19/2020 10:44 AM

## 2020-11-19 NOTE — Progress Notes (Signed)
LMP: 11/09/2020  Pt states she is passing clots, and having sharp pains in abdominal area.

## 2020-11-20 ENCOUNTER — Ambulatory Visit: Payer: BC Managed Care – PPO | Admitting: Obstetrics

## 2020-11-20 LAB — CERVICOVAGINAL ANCILLARY ONLY
Bacterial Vaginitis (gardnerella): NEGATIVE
Candida Glabrata: NEGATIVE
Candida Vaginitis: NEGATIVE
Chlamydia: NEGATIVE
Comment: NEGATIVE
Comment: NEGATIVE
Comment: NEGATIVE
Comment: NEGATIVE
Comment: NEGATIVE
Comment: NORMAL
Neisseria Gonorrhea: NEGATIVE
Trichomonas: NEGATIVE

## 2020-12-04 ENCOUNTER — Other Ambulatory Visit: Payer: Self-pay | Admitting: *Deleted

## 2020-12-04 ENCOUNTER — Encounter: Payer: Self-pay | Admitting: *Deleted

## 2020-12-04 ENCOUNTER — Other Ambulatory Visit: Payer: Self-pay | Admitting: Obstetrics

## 2020-12-04 DIAGNOSIS — N939 Abnormal uterine and vaginal bleeding, unspecified: Secondary | ICD-10-CM

## 2020-12-04 DIAGNOSIS — E282 Polycystic ovarian syndrome: Secondary | ICD-10-CM

## 2020-12-04 NOTE — Progress Notes (Signed)
Referral to reproductive endocrinology placed per orders Dr. Jodi Mourning. Patient notified via [place].

## 2020-12-06 DIAGNOSIS — F411 Generalized anxiety disorder: Secondary | ICD-10-CM | POA: Diagnosis not present

## 2020-12-06 DIAGNOSIS — F321 Major depressive disorder, single episode, moderate: Secondary | ICD-10-CM | POA: Diagnosis not present

## 2020-12-18 ENCOUNTER — Telehealth: Payer: Self-pay | Admitting: *Deleted

## 2020-12-18 NOTE — Telephone Encounter (Signed)
Return TC to patient regarding report of severe pain and request for stronger pain medication. Patient reports unable to see reproductive endocrinology until October. Dr. Jodi Mourning consulted regarding patient request. Request declined. Recommendation for patient to go to ED as needed for severe pain. Patient notified that we are unable to send in RX for stronger pain medication and of Dr. Jacelyn Grip recommendation.

## 2020-12-20 DIAGNOSIS — F321 Major depressive disorder, single episode, moderate: Secondary | ICD-10-CM | POA: Diagnosis not present

## 2020-12-20 DIAGNOSIS — F411 Generalized anxiety disorder: Secondary | ICD-10-CM | POA: Diagnosis not present

## 2020-12-21 NOTE — Progress Notes (Signed)
Subjective:    Eileen Todd - 35 y.o. female MRN [id number]  Date of birth: Sep 01, 1985  HPI  Eileen Todd is to establish care.  Current issues and/or concerns: Reports history of prolonged and heavy menses since 2017. Had total of 12 blood infusions since that time. Also, history of PCOS of which she is established with Obstetrics/Gynecology. Initially thought prolonged menses related to PCOS. Reports OB/GYN recommended a total hysterectomy or referral to a fertility doctor as her and her husband would like to have a baby. Reports she has an appointment with Endocrinology on next week. Reports in the past was seen by Gastroenterology and was diagnosed and treated for Celiac's disease of which was later determined that she did not have.   ROS per HPI     Health Maintenance:  Health Maintenance Due  Topic Date Due   COVID-19 Vaccine (1) Never done   Hepatitis C Screening  Never done   TETANUS/TDAP  Never done   INFLUENZA VACCINE  Never done    Past Medical History: Patient Active Problem List   Diagnosis Date Noted   Other iron deficiency anemias 12/25/2020   Symptomatic anemia 08/09/2020   Vaginal bleeding 08/09/2020   PCOS (polycystic ovarian syndrome) 08/09/2020   Acute cholecystitis due to biliary calculus 12/17/2019    Social History   reports that she has never smoked. She has never used smokeless tobacco. She reports that she does not drink alcohol and does not use drugs.   Family History  family history is not on file.   Medications: reviewed and updated   Objective:   Physical Exam BP 116/82 (BP Location: Left Arm, Patient Position: Sitting, Cuff Size: Large)   Pulse (!) 106   Temp 97.9 F (36.6 C)   Resp 18   Ht 5' 7.01" (1.702 m)   Wt 261 lb 12.8 oz (118.8 kg)   SpO2 97%   BMI 40.99 kg/m   Physical Exam HENT:     Head: Normocephalic and atraumatic.  Eyes:     Extraocular Movements: Extraocular movements intact.     Conjunctiva/sclera:  Conjunctivae normal.     Pupils: Pupils are equal, round, and reactive to light.  Cardiovascular:     Rate and Rhythm: Tachycardia present.     Pulses: Normal pulses.     Heart sounds: Normal heart sounds.  Pulmonary:     Effort: Pulmonary effort is normal.     Breath sounds: Normal breath sounds.  Musculoskeletal:     Cervical back: Normal range of motion and neck supple.  Neurological:     General: No focal deficit present.     Mental Status: She is alert and oriented to person, place, and time.  Psychiatric:        Mood and Affect: Mood normal.        Behavior: Behavior normal.       Assessment & Plan:  1. Encounter to establish care: - Patient presents today to establish care.  - Return for annual physical examination, labs, and health maintenance. Arrive fasting meaning having no food for at least 8 hours prior to appointment. You may have only water or black coffee. Please take scheduled medications as normal.  2. Flu vaccine need: - Administered today in office.   Patient was given clear instructions to go to Emergency Department or return to medical center if symptoms don't improve, worsen, or new problems develop.The patient verbalized understanding.  I discussed the assessment and treatment plan with the  patient. The patient was provided an opportunity to ask questions and all were answered. The patient agreed with the plan and demonstrated an understanding of the instructions.   The patient was advised to call back or seek an in-person evaluation if the symptoms worsen or if the condition fails to improve as anticipated.    Durene Fruits, NP 12/25/2020, 2:18 PM Primary Care at [facility]

## 2020-12-25 ENCOUNTER — Encounter: Payer: Self-pay | Admitting: Family

## 2020-12-25 ENCOUNTER — Ambulatory Visit (INDEPENDENT_AMBULATORY_CARE_PROVIDER_SITE_OTHER): Payer: BC Managed Care – PPO | Admitting: Family

## 2020-12-25 ENCOUNTER — Other Ambulatory Visit: Payer: Self-pay

## 2020-12-25 VITALS — BP 116/82 | HR 106 | Temp 97.9°F | Resp 18 | Ht 67.01 in | Wt 261.8 lb

## 2020-12-25 DIAGNOSIS — Z7689 Persons encountering health services in other specified circumstances: Secondary | ICD-10-CM

## 2020-12-25 DIAGNOSIS — Z23 Encounter for immunization: Secondary | ICD-10-CM | POA: Diagnosis not present

## 2020-12-25 DIAGNOSIS — D508 Other iron deficiency anemias: Secondary | ICD-10-CM | POA: Insufficient documentation

## 2020-12-25 NOTE — Patient Instructions (Signed)
Thank you for choosing Primary Care at [facility] for your medical home!    Eileen Todd was seen by [name], NP today.   Eileen Todd's primary care provider is Cheyanne Lamison Zachery Dauer, NP.   For the best care possible,  you should try to see Durene Fruits, NP whenever you come to clinic.   We look forward to seeing you again soon!  If you have any questions about your visit today,  please call us at [phone]  Or feel free to reach your provider via [facility].    Keeping you healthy   Get these tests Blood pressure- Have your blood pressure checked once a year by your healthcare provider.  Normal blood pressure is 120/80. Weight- Have your body mass index (BMI) calculated to screen for obesity.  BMI is a measure of body fat based on height and weight. You can also calculate your own BMI at GravelBags.it. Cholesterol- Have your cholesterol checked regularly starting at age 27, sooner may be necessary if you have diabetes, high blood pressure, if a family member developed heart diseases at an early age or if you smoke.  Chlamydia, HIV, and other sexual transmitted disease- Get screened each year until the age of 89 then within three months of each new sexual partner. Diabetes- Have your blood sugar checked regularly if you have high blood pressure, high cholesterol, a family history of diabetes or if you are overweight.   Get these vaccines Flu shot- Every fall. Tetanus shot- Every 10 years. Menactra- Single dose; prevents meningitis.   Take these steps Don't smoke- If you do smoke, ask your healthcare provider about quitting. For tips on how to quit, go to www.smokefree.gov or call 1-800-QUIT-NOW. Be physically active- Exercise 5 days a week for at least 30 minutes.  If you are not already physically active start slow and gradually work up to 30 minutes of moderate physical activity.  Examples of moderate activity include walking briskly, mowing the yard, dancing,  swimming bicycling, etc. Eat a healthy diet- Eat a variety of healthy foods such as fruits, vegetables, low fat milk, low fat cheese, yogurt, lean meats, poultry, fish, beans, tofu, etc.  For more information on healthy eating, go to www.thenutritionsource.org Drink alcohol in moderation- Limit alcohol intake two drinks or less a day.  Never drink and drive. Dentist- Brush and floss teeth twice daily; visit your dentis twice a year. Depression-Your emotional health is as important as your physical health.  If you're feeling down, losing interest in things you normally enjoy please talk with your healthcare provider. Gun Safety- If you keep a gun in your home, keep it unloaded and with the safety lock on.  Bullets should be stored separately. Helmet use- Always wear a helmet when riding a motorcycle, bicycle, rollerblading or skateboarding. Safe sex- If you may be exposed to a sexually transmitted infection, use a condom Seat belts- Seat bels can save your life; always wear one. Smoke/Carbon Monoxide detectors- These detectors need to be installed on the appropriate level of your home.  Replace batteries at least once a year. Skin Cancer- When out in the sun, cover up and use sunscreen SPF 15 or higher. Violence- If anyone is threatening or hurting you, please tell your healthcare provider.

## 2020-12-25 NOTE — Progress Notes (Signed)
Pt presents to establish care, desires flu vaccine

## 2020-12-25 NOTE — Addendum Note (Signed)
Addended by: Elmon Else on: 12/25/2020 02:27 PM   Modules accepted: Orders

## 2020-12-31 DIAGNOSIS — E282 Polycystic ovarian syndrome: Secondary | ICD-10-CM | POA: Diagnosis not present

## 2020-12-31 DIAGNOSIS — N939 Abnormal uterine and vaginal bleeding, unspecified: Secondary | ICD-10-CM | POA: Diagnosis not present

## 2020-12-31 DIAGNOSIS — Z319 Encounter for procreative management, unspecified: Secondary | ICD-10-CM | POA: Diagnosis not present

## 2021-01-30 ENCOUNTER — Ambulatory Visit: Payer: BC Managed Care – PPO | Admitting: Family

## 2021-01-31 ENCOUNTER — Other Ambulatory Visit: Payer: Self-pay | Admitting: Obstetrics and Gynecology

## 2021-01-31 DIAGNOSIS — F321 Major depressive disorder, single episode, moderate: Secondary | ICD-10-CM | POA: Diagnosis not present

## 2021-01-31 DIAGNOSIS — F411 Generalized anxiety disorder: Secondary | ICD-10-CM | POA: Diagnosis not present

## 2021-02-01 ENCOUNTER — Encounter: Payer: Self-pay | Admitting: Family

## 2021-02-04 NOTE — Progress Notes (Signed)
Patient ID: Eileen Todd, female    DOB: [birth year]  MRN: [id number]  CC: Discussion of Labs   Subjective: Eileen Todd is a 35 y.o. female who presents for discussion of labs.   Her concerns today include:  Recently a nurse came to her home to collect blood work prior to approval of life insurance. Reports she was told blood work would be sent directly to a lab and subsequently results sent to life insurance underwriters for review. After lab results returned life insurance policy called and asked about possible alcohol abuse related to an increased lab result, GGT, and to complete an alcohol abuse form. Patient reports she infrequently drinks socially. Denies stomach pain, nausea, vomiting, blood in the stool, clay-colored stool and any additional red flag symptoms. Recently taking Ibuprofen more often over the last couple months related to endometriosis. Concern that new medications per Gynecology, Edward Qualia and Femara, may be the cause of abnormal lab result.    Patient Active Problem List   Diagnosis Date Noted   Other iron deficiency anemias 12/25/2020   Symptomatic anemia 08/09/2020   Vaginal bleeding 08/09/2020   PCOS (polycystic ovarian syndrome) 08/09/2020   Acute cholecystitis due to biliary calculus 12/17/2019     Current Outpatient Medications on File Prior to Visit  Medication Sig Dispense Refill   Elagolix Sodium (ORILISSA) 200 MG TABS Take 200 mg by mouth.     ibuprofen (ADVIL) 800 MG tablet Take 1 tablet (800 mg total) by mouth every 8 (eight) hours as needed. 30 tablet 5   letrozole (FEMARA) 2.5 MG tablet Take 2.5 mg by mouth daily.     metFORMIN (GLUCOPHAGE XR) 500 MG 24 hr tablet Take 3 tablets (1,500 mg total) by mouth daily after supper. 90 tablet 11   prenatal vitamin w/FE, FA (PRENATAL 1 + 1) 27-1 MG TABS tablet Take 1 tablet by mouth daily before breakfast. 30 tablet 11   vitamin B-12 (CYANOCOBALAMIN) 1000 MCG tablet Take 2,000 mcg by mouth daily.     ferrous  sulfate 325 (65 FE) MG tablet Take 1 tablet (325 mg total) by mouth daily with breakfast. 30 tablet 0   No current facility-administered medications on file prior to visit.    No Known Allergies  Social History   Socioeconomic History   Marital status: Married    Spouse name: Not on file   Number of children: Not on file   Years of education: Not on file   Highest education level: Not on file  Occupational History   Not on file  Tobacco Use   Smoking status: Never   Smokeless tobacco: Never  Vaping Use   Vaping Use: Never used  Substance and Sexual Activity   Alcohol use: Never   Drug use: Never   Sexual activity: Yes    Partners: Male  Other Topics Concern   Not on file  Social History Narrative   Not on file   Social Determinants of Health   Financial Resource Strain: Not on file  Food Insecurity: Not on file  Transportation Needs: Not on file  Physical Activity: Not on file  Stress: Not on file  Social Connections: Not on file  Intimate Partner Violence: Not on file    No family history on file.  Past Surgical History:  Procedure Laterality Date   CHOLECYSTECTOMY N/A 12/18/2019   Procedure: LAPAROSCOPIC CHOLECYSTECTOMY;  Surgeon: Kinsinger, Arta Bruce, MD;  Location: [place];  Service: General;  Laterality: N/A;   DILATION AND  CURETTAGE OF UTERUS     WISDOM TOOTH EXTRACTION      ROS: Review of Systems Negative except as stated above  PHYSICAL EXAM: BP 129/83   Pulse 88   Resp 20   Ht 5\' 7"  (1.702 m)   Wt 258 lb (117 kg)   SpO2 98%   BMI 40.41 kg/m   Physical Exam HENT:     Head: Normocephalic and atraumatic.  Eyes:     Extraocular Movements: Extraocular movements intact.     Conjunctiva/sclera: Conjunctivae normal.     Pupils: Pupils are equal, round, and reactive to light.  Cardiovascular:     Rate and Rhythm: Normal rate and regular rhythm.     Pulses: Normal pulses.     Heart sounds: Normal heart sounds.  Pulmonary:     Effort:  Pulmonary effort is normal.     Breath sounds: Normal breath sounds.  Musculoskeletal:     Cervical back: Normal range of motion and neck supple.  Neurological:     General: No focal deficit present.     Mental Status: She is alert and oriented to person, place, and time.  Psychiatric:        Mood and Affect: Mood normal.        Behavior: Behavior normal.    ASSESSMENT AND PLAN: 1. Elevated serum GGT level: - Gamma Glutamyl Transpeptidase, GGT, 78 on 01/30/2021. - Referral to Gastroenterology for further evaluation and management.  - Follow-up with primary provider as scheduled.  - Ambulatory referral to Gastroenterology  2. PCOS (polycystic ovarian syndrome): 3. Endometriosis: - Counseled to follow-up with Gynecology regarding if Elagolix Sodium and Letrozole may be causative agents of increased GGT, patient agreeable. - Keep all scheduled appointments with Gynecology.    Patient was given the opportunity to ask questions.  Patient verbalized understanding of the plan and was able to repeat key elements of the plan. Patient was given clear instructions to go to Emergency Department or return to medical center if symptoms don't improve, worsen, or new problems develop.The patient verbalized understanding.   Orders Placed This Encounter  Procedures   Ambulatory referral to Gastroenterology    Follow-up with primary provider as scheduled. Referral to Gastroenterology.   [name], NP

## 2021-02-05 ENCOUNTER — Other Ambulatory Visit: Payer: Self-pay

## 2021-02-05 ENCOUNTER — Ambulatory Visit (INDEPENDENT_AMBULATORY_CARE_PROVIDER_SITE_OTHER): Payer: BC Managed Care – PPO | Admitting: Family

## 2021-02-05 ENCOUNTER — Encounter: Payer: Self-pay | Admitting: Family

## 2021-02-05 VITALS — BP 129/83 | HR 88 | Resp 20 | Ht 67.0 in | Wt 258.0 lb

## 2021-02-05 DIAGNOSIS — R748 Abnormal levels of other serum enzymes: Secondary | ICD-10-CM

## 2021-02-05 DIAGNOSIS — E282 Polycystic ovarian syndrome: Secondary | ICD-10-CM | POA: Diagnosis not present

## 2021-02-05 DIAGNOSIS — N809 Endometriosis, unspecified: Secondary | ICD-10-CM

## 2021-02-05 NOTE — Progress Notes (Signed)
Concerns about abnormal lab test

## 2021-02-11 ENCOUNTER — Other Ambulatory Visit: Payer: Self-pay | Admitting: Obstetrics

## 2021-02-11 DIAGNOSIS — N939 Abnormal uterine and vaginal bleeding, unspecified: Secondary | ICD-10-CM

## 2021-02-14 DIAGNOSIS — F411 Generalized anxiety disorder: Secondary | ICD-10-CM | POA: Diagnosis not present

## 2021-02-14 DIAGNOSIS — F321 Major depressive disorder, single episode, moderate: Secondary | ICD-10-CM | POA: Diagnosis not present

## 2021-03-14 DIAGNOSIS — F411 Generalized anxiety disorder: Secondary | ICD-10-CM | POA: Diagnosis not present

## 2021-03-14 DIAGNOSIS — F321 Major depressive disorder, single episode, moderate: Secondary | ICD-10-CM | POA: Diagnosis not present

## 2021-03-19 DIAGNOSIS — R102 Pelvic and perineal pain: Secondary | ICD-10-CM | POA: Diagnosis not present

## 2021-03-26 ENCOUNTER — Encounter (HOSPITAL_BASED_OUTPATIENT_CLINIC_OR_DEPARTMENT_OTHER): Payer: Self-pay

## 2021-03-26 ENCOUNTER — Ambulatory Visit (HOSPITAL_BASED_OUTPATIENT_CLINIC_OR_DEPARTMENT_OTHER): Admit: 2021-03-26 | Payer: BC Managed Care – PPO | Admitting: Obstetrics and Gynecology

## 2021-03-26 DIAGNOSIS — N838 Other noninflammatory disorders of ovary, fallopian tube and broad ligament: Secondary | ICD-10-CM | POA: Diagnosis not present

## 2021-03-26 DIAGNOSIS — N80209 Endometriosis of unspecified fallopian tube, unspecified depth: Secondary | ICD-10-CM | POA: Diagnosis not present

## 2021-03-26 DIAGNOSIS — N80319 Endometriosis of the anterior cul-de-sac, unspecified depth: Secondary | ICD-10-CM | POA: Diagnosis not present

## 2021-03-26 DIAGNOSIS — N80109 Endometriosis of ovary, unspecified side, unspecified depth: Secondary | ICD-10-CM | POA: Diagnosis not present

## 2021-03-26 DIAGNOSIS — N939 Abnormal uterine and vaginal bleeding, unspecified: Secondary | ICD-10-CM | POA: Diagnosis not present

## 2021-03-26 DIAGNOSIS — R102 Pelvic and perineal pain: Secondary | ICD-10-CM | POA: Diagnosis not present

## 2021-03-26 DIAGNOSIS — N80329 Endometriosis of the posterior cul-de-sac, unspecified depth: Secondary | ICD-10-CM | POA: Diagnosis not present

## 2021-03-26 DIAGNOSIS — N840 Polyp of corpus uteri: Secondary | ICD-10-CM | POA: Diagnosis not present

## 2021-03-26 DIAGNOSIS — N809 Endometriosis, unspecified: Secondary | ICD-10-CM | POA: Insufficient documentation

## 2021-03-26 DIAGNOSIS — N80103 Endometriosis of bilateral ovaries, unspecified depth: Secondary | ICD-10-CM | POA: Diagnosis not present

## 2021-03-26 DIAGNOSIS — N80111 Superficial endometriosis of right ovary: Secondary | ICD-10-CM | POA: Diagnosis not present

## 2021-03-26 DIAGNOSIS — N736 Female pelvic peritoneal adhesions (postinfective): Secondary | ICD-10-CM | POA: Diagnosis not present

## 2021-03-26 SURGERY — LAPAROSCOPY, DIAGNOSTIC
Anesthesia: General

## 2021-04-11 DIAGNOSIS — F411 Generalized anxiety disorder: Secondary | ICD-10-CM | POA: Diagnosis not present

## 2021-05-06 ENCOUNTER — Encounter: Payer: Self-pay | Admitting: Emergency Medicine

## 2021-05-06 ENCOUNTER — Ambulatory Visit
Admission: EM | Admit: 2021-05-06 | Discharge: 2021-05-06 | Disposition: A | Discharge status: Home / Self Care | Payer: BC Managed Care – PPO | Attending: Internal Medicine | Admitting: Internal Medicine

## 2021-05-06 DIAGNOSIS — K529 Noninfective gastroenteritis and colitis, unspecified: Secondary | ICD-10-CM | POA: Diagnosis not present

## 2021-05-06 HISTORY — DX: Endometriosis, unspecified: N80.9

## 2021-05-06 MED ORDER — ONDANSETRON HCL 4 MG/2ML IJ SOLN
4.0000 mg | Freq: Once | INTRAMUSCULAR | Status: AC
  Administered 2021-05-06: 4 mg via INTRAMUSCULAR

## 2021-05-06 MED ORDER — ONDANSETRON 4 MG PO TBDP: 4.0000 mg | ORAL_TABLET | Freq: Once | ORAL | Status: DC

## 2021-05-06 MED ORDER — ONDANSETRON 8 MG PO TBDP: 8.0000 mg | ORAL_TABLET | Freq: Three times a day (TID) | ORAL | 0 refills | Status: DC | PRN

## 2021-05-06 NOTE — Discharge Instructions (Addendum)
Your symptoms sound like viral gastroenteritis.  This is a self-limited condition that will eventually resolve itself. We will combat the nausea and vomiting with as needed Zofran. You were given a 4 mg injection around 3:30 this afternoon.  You can repeat another dose at home this evening around 7:30pm if needed. Put the Zofran under your tongue and let it dissolve sublingually.  You can take it every 8 hours as needed. Please continue small sips of water, do not drink too much or it will cause you to vomit.  Alternatives to water include Pedialyte or Gatorade.  Ginger ale is acceptable, but please only use a small amount due to the high sugar content which can be further dehydrating. Advance the diet slowly as tolerated, starting with saltine crackers. Head to ER for any worsening or persistent vomiting.

## 2021-05-06 NOTE — ED Triage Notes (Signed)
Vomiting and diarrhea multiple times starting around 1 am this morning. C/o generalized abdominal burning. Hx of endometriosis and PCOS, on trx to suppress ovulation due to this, denies possibility of pregnancy. 6/10 abdominal pain. Denies blood in stool or vomiting, problems with urination.

## 2021-05-06 NOTE — ED Provider Notes (Signed)
[facility]    CSN: [id number] Arrival date & time: 05/06/21  1350      History   Chief Complaint No chief complaint on file.   HPI Eileen Todd is a 36 y.o. female.   36 year old female presents today with an acute onset of nausea, vomiting and diarrhea starting around 1:30am this morning.  She reports generalized abdominal discomfort from the forceful vomiting.  She has vomited about 7 times and had diarrhea about 6. She states her niece and brother-in-law are also sick with very similar symptoms and believes she got it from them this weekend.  She denies headache, fever, body aches.  She has been unable to tolerate any food or liquids all day.    Past Medical History:  Diagnosis Date   Endometriosis    Family history of adverse reaction to anesthesia    PCOS (polycystic ovarian syndrome)     Patient Active Problem List   Diagnosis Date Noted   Other iron deficiency anemias 12/25/2020   Symptomatic anemia 08/09/2020   Vaginal bleeding 08/09/2020   PCOS (polycystic ovarian syndrome) 08/09/2020   Acute cholecystitis due to biliary calculus 12/17/2019    Past Surgical History:  Procedure Laterality Date   CHOLECYSTECTOMY N/A 12/18/2019   Procedure: LAPAROSCOPIC CHOLECYSTECTOMY;  Surgeon: Kinsinger, Arta Bruce, MD;  Location: [place];  Service: General;  Laterality: N/A;   DILATION AND CURETTAGE OF UTERUS     WISDOM TOOTH EXTRACTION      OB History     Gravida  0   Para  0   Term  0   Preterm  0   AB  0   Living  0      SAB  0   IAB  0   Ectopic  0   Multiple  0   Live Births  0            Home Medications    Prior to Admission medications   Medication Sig Start Date End Date Taking? Authorizing Provider  Elagolix Sodium (ORILISSA) 200 MG TABS Take 200 mg by mouth.   Yes [provider]  ibuprofen (ADVIL) 800 MG tablet Take 1 tablet (800 mg total) by mouth every 8 (eight) hours as needed. 11/19/20  Yes Shelly Bombard, MD  letrozole [facility]) 2.5 MG tablet Take 2.5 mg by mouth daily.   Yes [provider]  metFORMIN (GLUCOPHAGE XR) 500 MG 24 hr tablet Take 3 tablets (1,500 mg total) by mouth daily after supper. 08/28/20  Yes Shelly Bombard, MD  ondansetron (ZOFRAN-ODT) 8 MG disintegrating tablet Take 1 tablet (8 mg total) by mouth every 8 (eight) hours as needed for nausea or vomiting. 05/06/21  Yes Cailey Trigueros L, PA  prenatal vitamin w/FE, FA (PRENATAL 1 + 1) 27-1 MG TABS tablet Take 1 tablet by mouth daily before breakfast. 08/28/20  Yes Shelly Bombard, MD  vitamin B-12 (CYANOCOBALAMIN) 1000 MCG tablet Take 2,000 mcg by mouth daily.   Yes [provider]  ferrous sulfate 325 (65 FE) MG tablet Take 1 tablet (325 mg total) by mouth daily with breakfast. 10/09/20   Shelly Bombard, MD    Family History No family history on file.  Social History Social History   Tobacco Use   Smoking status: Never   Smokeless tobacco: Never  Vaping Use   Vaping Use: Never used  Substance Use Topics   Alcohol use: Never   Drug use: Never  Allergies   Patient has no known allergies.   Review of Systems Review of Systems  Gastrointestinal:  Positive for diarrhea, nausea and vomiting.  All other systems reviewed and are negative.   Physical Exam Triage Vital Signs ED Triage Vitals [05/06/21 1523]  Enc Vitals Group     BP 109/77     Pulse Rate (!) 125     Resp 18     Temp 99.3 F (37.4 C)     Temp Source Oral     SpO2 95 %     Weight      Height      Head Circumference      Peak Flow      Pain Score 6     Pain Loc      Pain Edu?      Excl. in [place]?    No data found.  Updated Vital Signs BP 109/77 (BP Location: Left Arm)    Pulse (!) 125    Temp 99.3 F (37.4 C) (Oral)    Resp 18    SpO2 95%   Visual Acuity Right Eye Distance:   Left Eye Distance:   Bilateral Distance:    Right Eye Near:   Left Eye Near:    Bilateral Near:     Physical Exam Vitals and  nursing note reviewed. Exam conducted with a chaperone present.  Constitutional:      Appearance: Normal appearance. She is obese. She is ill-appearing. She is not toxic-appearing or diaphoretic.  HENT:     Head: Normocephalic and atraumatic.     Right Ear: Tympanic membrane, ear canal and external ear normal. There is no impacted cerumen.     Left Ear: Tympanic membrane, ear canal and external ear normal. There is no impacted cerumen.     Nose: Nose normal. No congestion or rhinorrhea.     Mouth/Throat:     Mouth: Mucous membranes are moist.     Pharynx: No oropharyngeal exudate or posterior oropharyngeal erythema.     Comments: Mild dryness to tongue, buccal mucosa okay Eyes:     General: No scleral icterus.       Right eye: No discharge.        Left eye: No discharge.     Extraocular Movements: Extraocular movements intact.     Conjunctiva/sclera: Conjunctivae normal.     Pupils: Pupils are equal, round, and reactive to light.  Cardiovascular:     Rate and Rhythm: Tachycardia present.     Heart sounds: No murmur heard. Pulmonary:     Effort: Pulmonary effort is normal. No respiratory distress.     Breath sounds: No stridor. No wheezing, rhonchi or rales.  Chest:     Chest wall: No tenderness.  Abdominal:     General: Abdomen is flat. There is no distension.     Palpations: Abdomen is soft.     Tenderness: There is abdominal tenderness (generalized mild discomfort, no signs of peritonitis or acute abdomen). There is no right CVA tenderness, left CVA tenderness, guarding or rebound.  Skin:    General: Skin is warm.     Coloration: Skin is pale. Skin is not jaundiced.     Findings: No erythema or rash.  Neurological:     Mental Status: She is alert.     Motor: No weakness.  Psychiatric:        Mood and Affect: Mood normal.     UC Treatments / Results  Labs (all labs ordered are  listed, but only abnormal results are displayed) Labs Reviewed - No data to  display  EKG   Radiology No results found.  Procedures Procedures (including critical care time)  Medications Ordered in UC Medications  ondansetron (ZOFRAN) injection 4 mg (4 mg Intramuscular Given 05/06/21 1532)    Initial Impression / Assessment and Plan / UC Course  I have reviewed the triage vital signs and the nursing notes.  Pertinent labs & imaging results that were available during my care of the patient were reviewed by me and considered in my medical decision making (see chart for details).     Viral gastroenteritis - zofran 4mg  IM given around 3:30. Pt was given water and monitored her sipping for 30 minutes. The abdominal pain and vomiting had resolved and she was able to tolerate fluids. Will DC home with zofran for PRN use, supportive measures. No indication to provide IV in office as tolerating liquids PO upon DC. ER precautions reviewed.  Final Clinical Impressions(s) / UC Diagnoses   Final diagnoses:  Gastroenteritis, acute     Discharge Instructions      Your symptoms sound like viral gastroenteritis.  This is a self-limited condition that will eventually resolve itself. We will combat the nausea and vomiting with as needed Zofran. You were given a 4 mg injection around 3:30 this afternoon.  You can repeat another dose at home this evening around 7:30pm if needed. Put the Zofran under your tongue and let it dissolve sublingually.  You can take it every 8 hours as needed. Please continue small sips of water, do not drink too much or it will cause you to vomit.  Alternatives to water include Pedialyte or Gatorade.  Ginger ale is acceptable, but please only use a small amount due to the high sugar content which can be further dehydrating. Advance the diet slowly as tolerated, starting with saltine crackers. Head to ER for any worsening or persistent vomiting.    ED Prescriptions     Medication Sig Dispense Auth. Provider   ondansetron (ZOFRAN-ODT) 8 MG  disintegrating tablet Take 1 tablet (8 mg total) by mouth every 8 (eight) hours as needed for nausea or vomiting. 20 tablet Persais Ethridge L, [place]      PDMP not reviewed this encounter.   Chaney Malling, [place] 05/06/21 1624

## 2021-05-09 DIAGNOSIS — F411 Generalized anxiety disorder: Secondary | ICD-10-CM | POA: Diagnosis not present

## 2021-05-31 DIAGNOSIS — F411 Generalized anxiety disorder: Secondary | ICD-10-CM | POA: Diagnosis not present

## 2021-06-20 DIAGNOSIS — F4389 Other reactions to severe stress: Secondary | ICD-10-CM | POA: Diagnosis not present

## 2021-07-18 DIAGNOSIS — F4389 Other reactions to severe stress: Secondary | ICD-10-CM | POA: Diagnosis not present

## 2021-08-27 ENCOUNTER — Other Ambulatory Visit: Payer: Self-pay | Admitting: Obstetrics

## 2021-08-27 DIAGNOSIS — D500 Iron deficiency anemia secondary to blood loss (chronic): Secondary | ICD-10-CM

## 2021-08-28 ENCOUNTER — Other Ambulatory Visit: Payer: Self-pay | Admitting: Obstetrics

## 2021-08-28 DIAGNOSIS — E282 Polycystic ovarian syndrome: Secondary | ICD-10-CM

## 2021-09-20 IMAGING — US US PELVIS COMPLETE WITH TRANSVAGINAL
1 series · 15 of 25 positions shown · non-contrast
Comparison: 12/17/2019

CLINICAL DATA: Vaginal bleeding, history of polycystic ovarian
syndrome

EXAM:
TRANSABDOMINAL AND TRANSVAGINAL ULTRASOUND OF PELVIS
TECHNIQUE: Both transabdominal and transvaginal ultrasound examinations of the
pelvis were performed. Transabdominal technique was performed for
global imaging of the pelvis including uterus, ovaries, adnexal
regions, and pelvic cul-de-sac. It was necessary to proceed with
endovaginal exam following the transabdominal exam to visualize the
endometrium and adnexal structures.

[Series 1: us pelvis complete mc & wl · 15 of 94 slices shown]
[im 1/94]
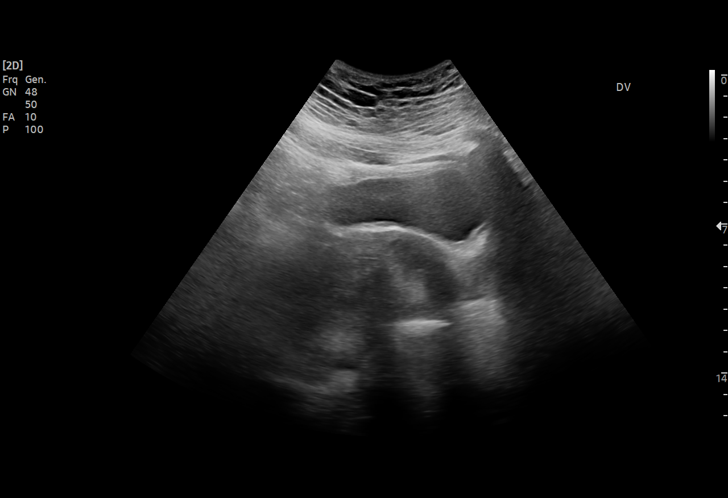
[im 8/94]
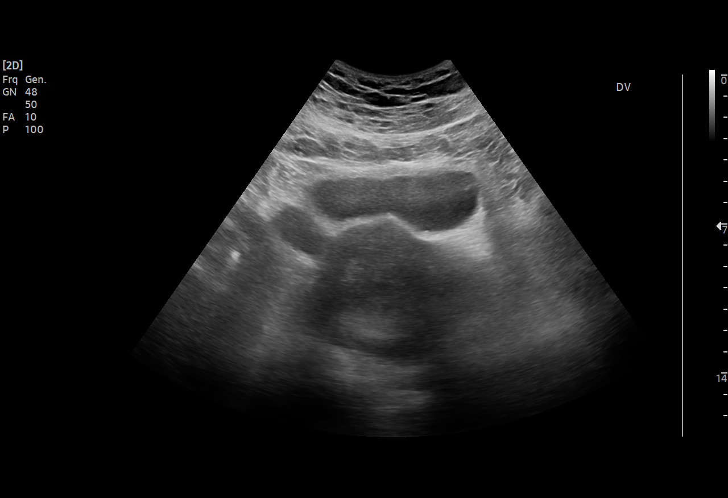
[im 16/94]
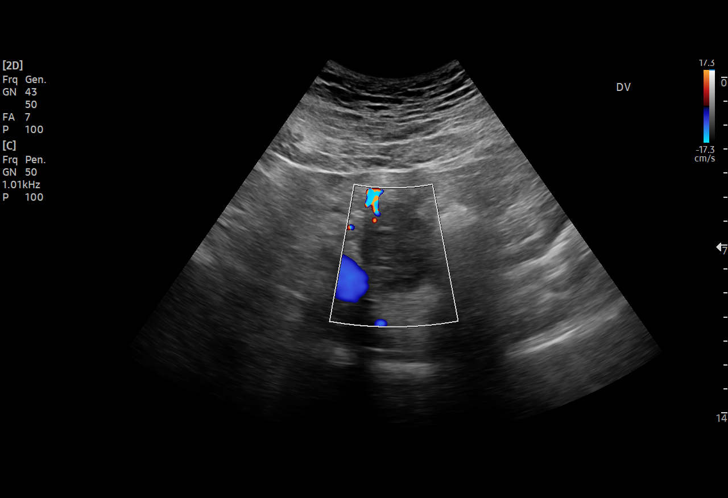
[im 20/94]
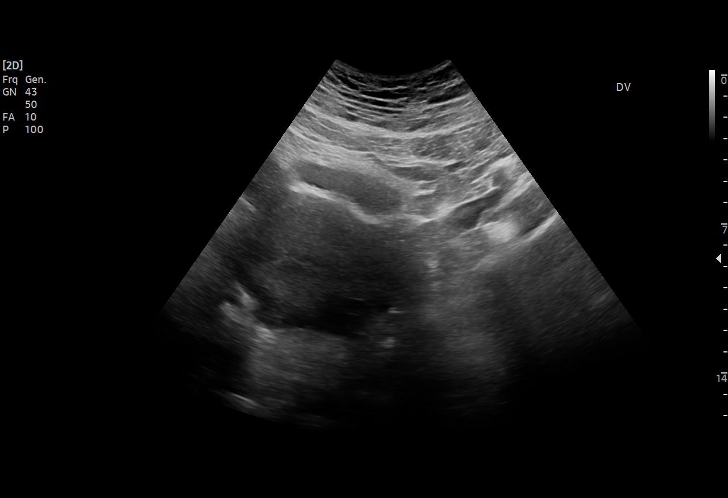
[im 28/94]
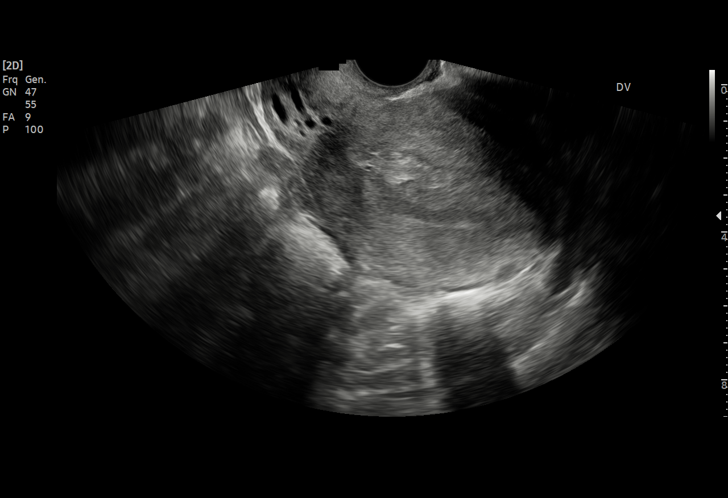
[im 35/94]
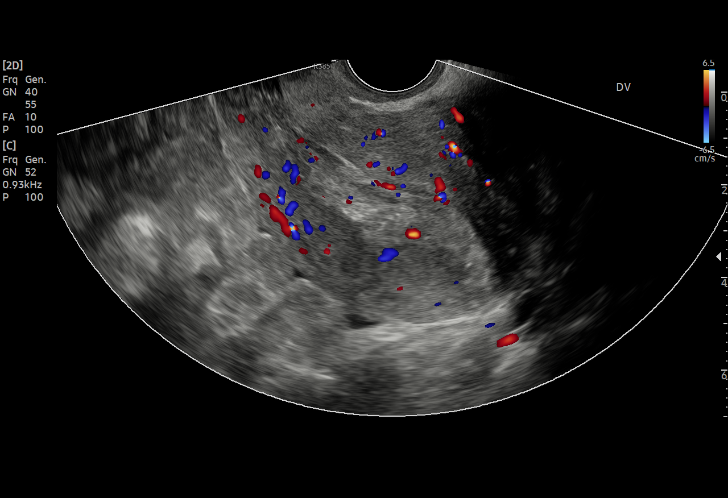
[im 39/94]
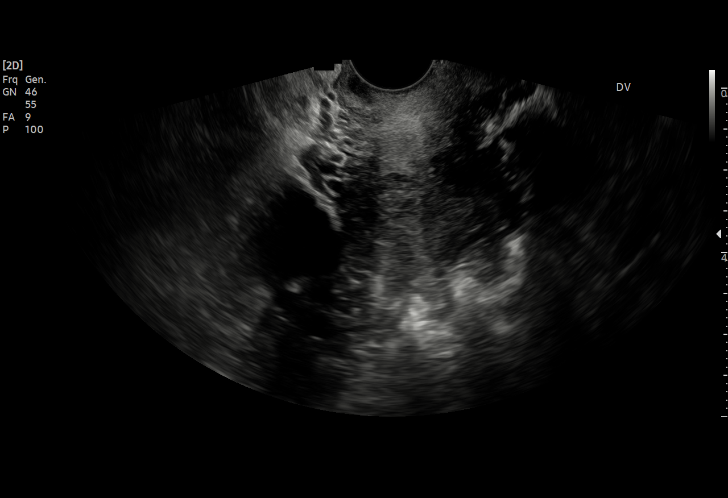
[im 47/94]
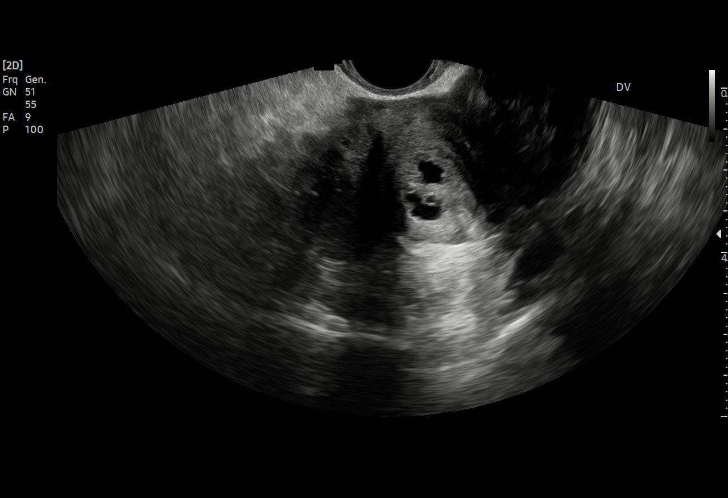
[im 55/94]
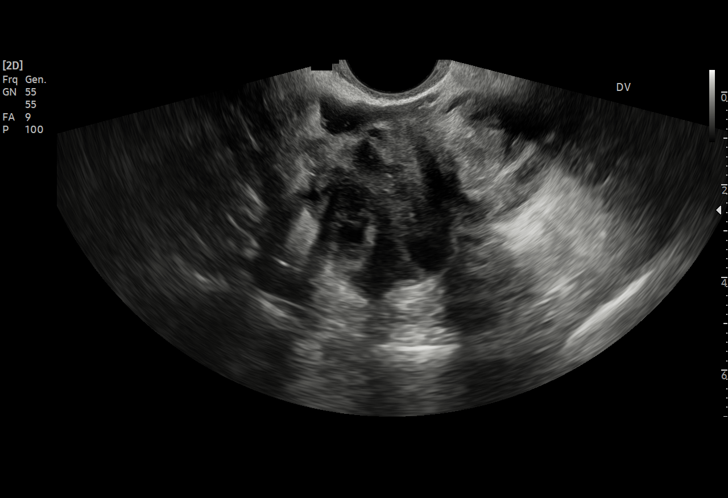
[im 59/94]
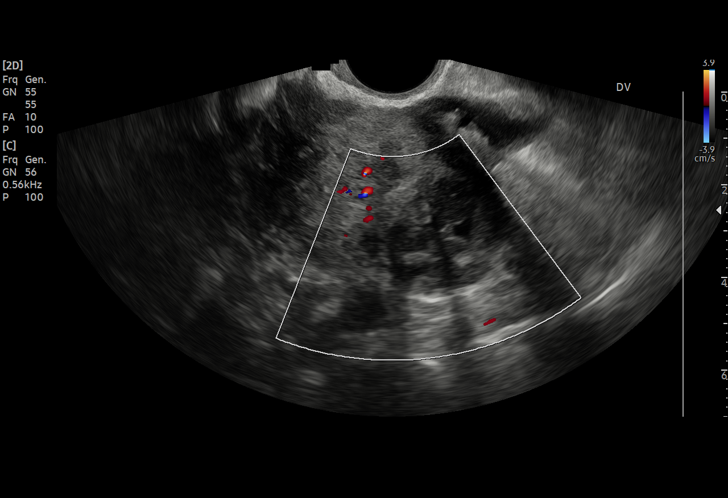
[im 66/94]
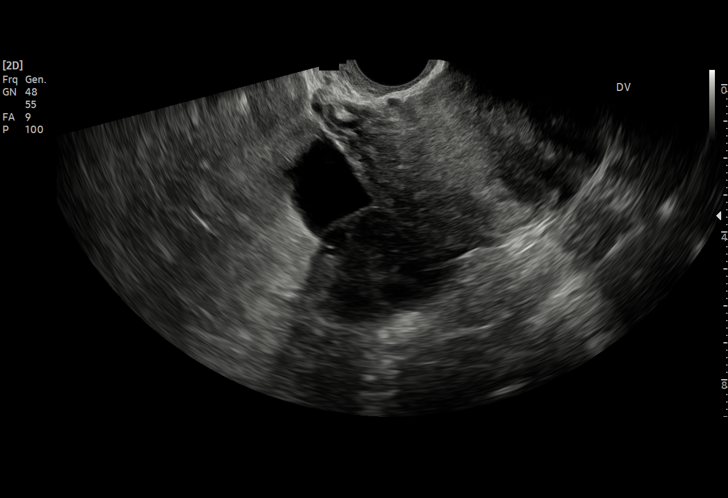
[im 74/94]
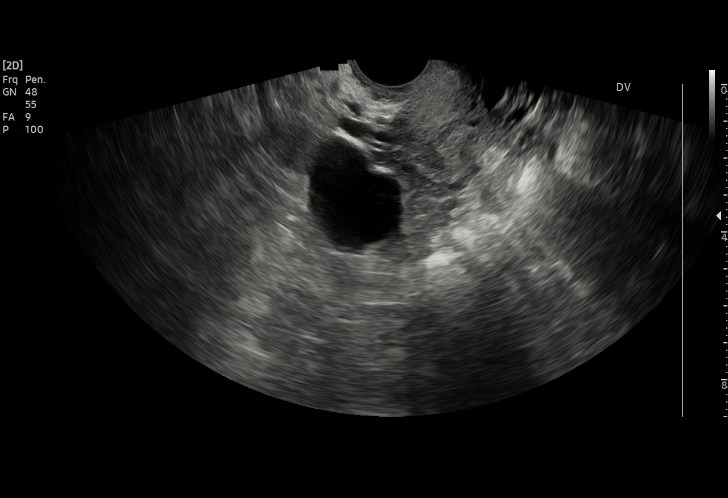
[im 78/94]
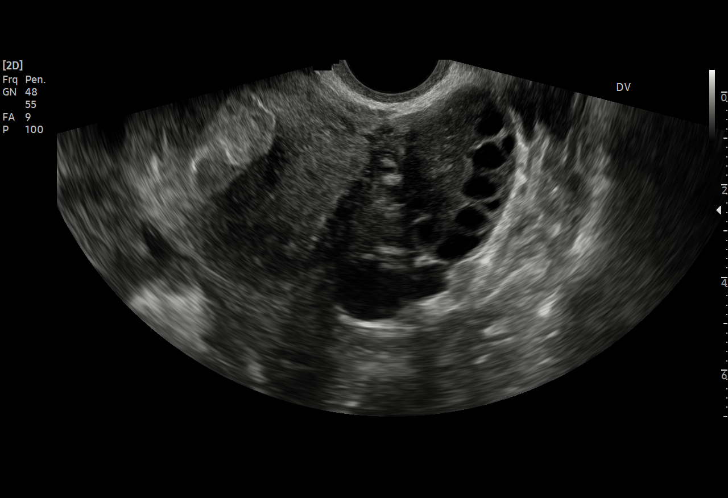
[im 86/94]
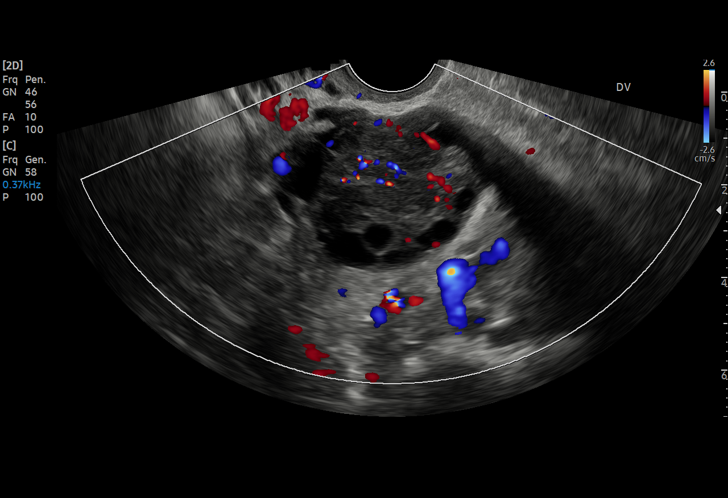
[im 94/94]
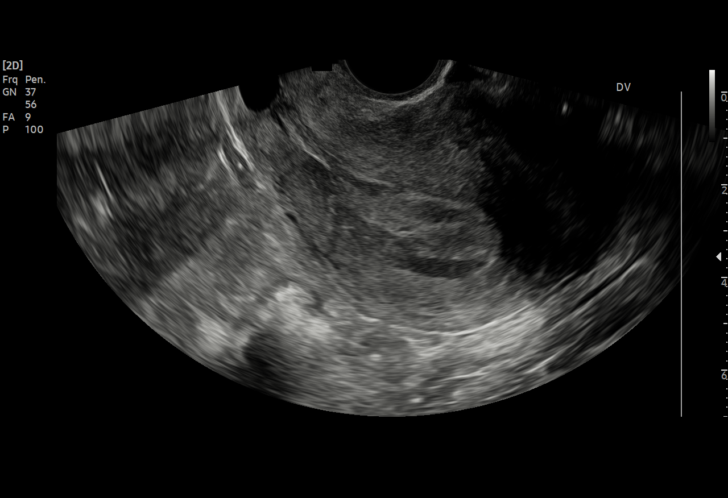

[15 of 25 positions shown; findings below may reference images not displayed]

FINDINGS: Uterus

Measurements: 9.2 x 5.8 by 6.5 cm = volume: 180 mL. Degenerating
fibroids are seen within the uterine fundus, measuring 2.1 x 2.9 x
2.6 cm and 1.7 x 2.9 x 2.5 cm.

Endometrium

Thickness: 17 mm.  Endometrium is heterogeneous and thickened.

Right ovary

Measurements: 4.5 x 2.9 by 2.9 cm = volume: 19.4 ML. There is an
exophytic follicle or paraovarian cyst in the right adnexa measuring
2.7 x 2.7 by 2.6 cm. This is simple in appearance without septation
or mural thickening.

Left ovary

Measurements: 2.7 x 4.2 x 3.8 cm = volume: 23 mL. Numerous
peripherally oriented follicles are seen, consistent with given
history of polycystic ovarian syndrome.

Other findings

Trace free fluid in the pelvis.
IMPRESSION: 1. Heterogeneous thickened endometrium measuring 17 mm. If bleeding
remains unresponsive to hormonal or medical therapy, focal lesion
work-up with sonohysterogram should be considered. Endometrial
biopsy should also be considered in pre-menopausal patients at high
risk for endometrial carcinoma. (Ref: Radiological Reasoning:
Algorithmic Workup of Abnormal Vaginal Bleeding with Endovaginal
Sonography and Sonohysterography. AJR [id number]; [id number])
2. Degenerating uterine fibroids.
3. Multiple follicles in the ovaries consistent with given history
of polycystic ovarian syndrome.
4. Simple paraovarian cyst or exophytic follicle off the right
ovary.

## 2021-12-26 IMAGING — US US TRANSVAGINAL NON-OB
1 series · 14 of 18 positions shown · non-contrast
Comparison: 10/12/2020 from 10/12/2020

CLINICAL DATA: Abnormal uterine bleeding with endometrial
thickening.

EXAM:
LIMITED ULTRASOUND OF PELVIS
TECHNIQUE: Limited transvaginal ultrasound examination of the pelvis was
performed. Prior to scheduled sonohysterogram.

[Series 1: us transvaginal non-ob · 0.16mm/px · 14 of 18 slices shown]
[im 1/18]
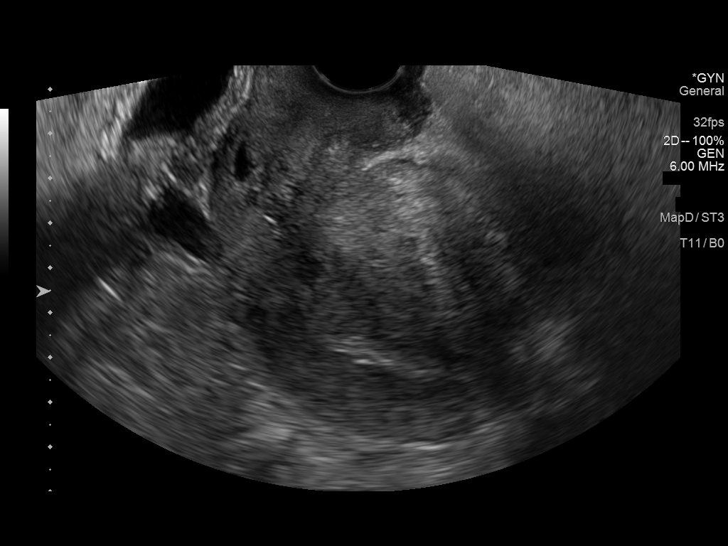
[im 2/18]
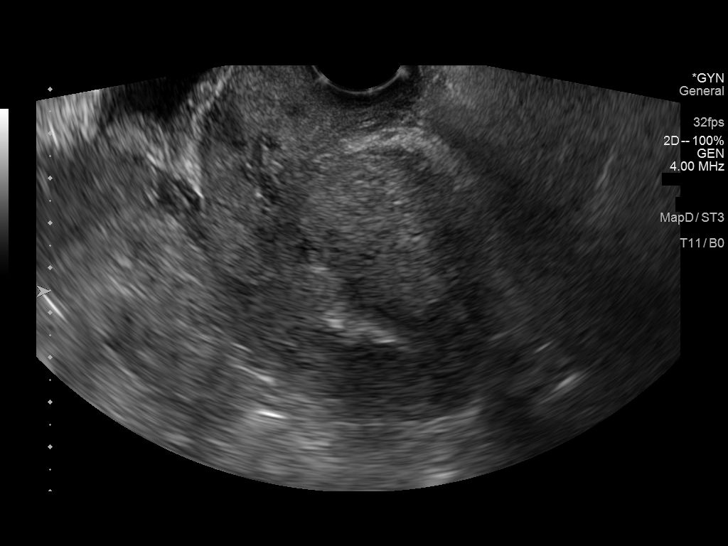
[im 4/18]
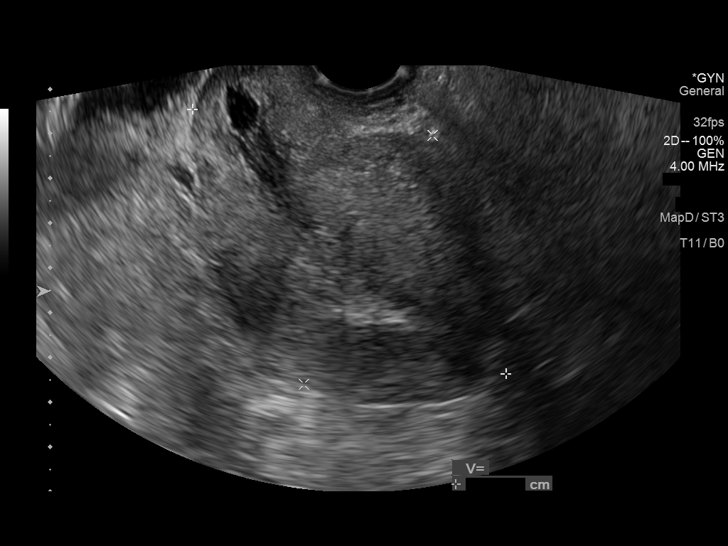
[im 5/18]
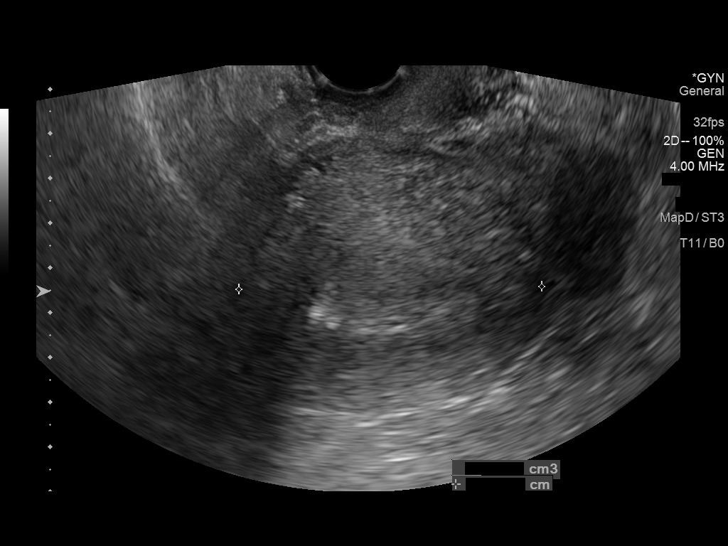
[im 6/18]
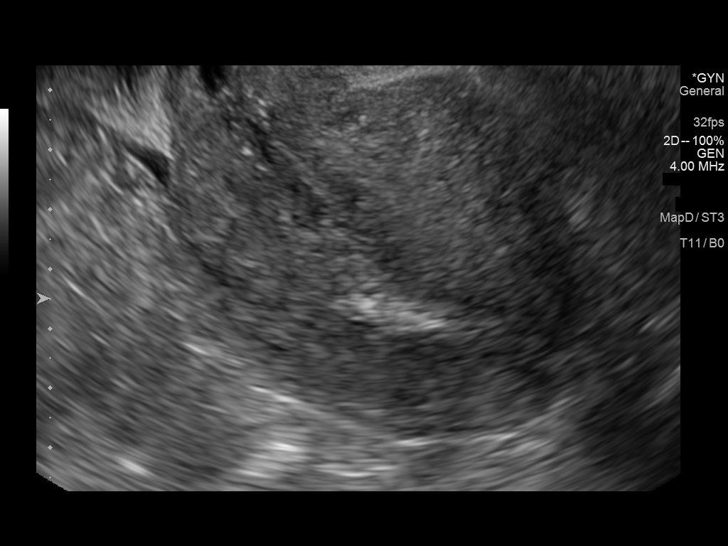
[im 8/18]
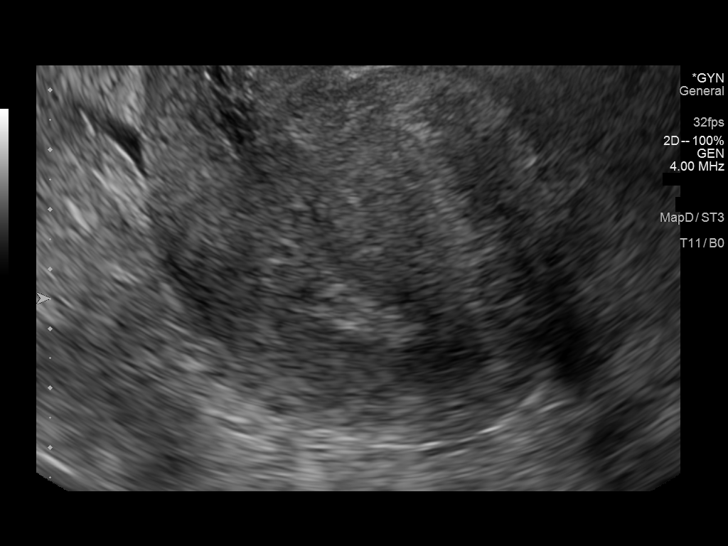
[im 9/18]
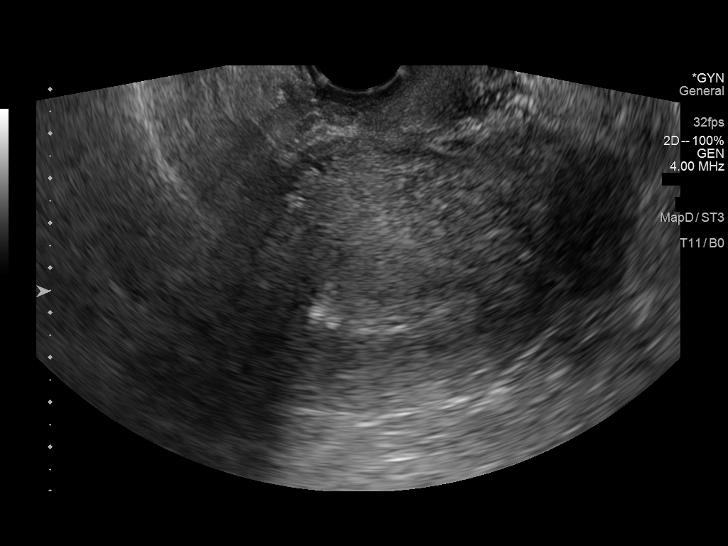
[im 10/18]
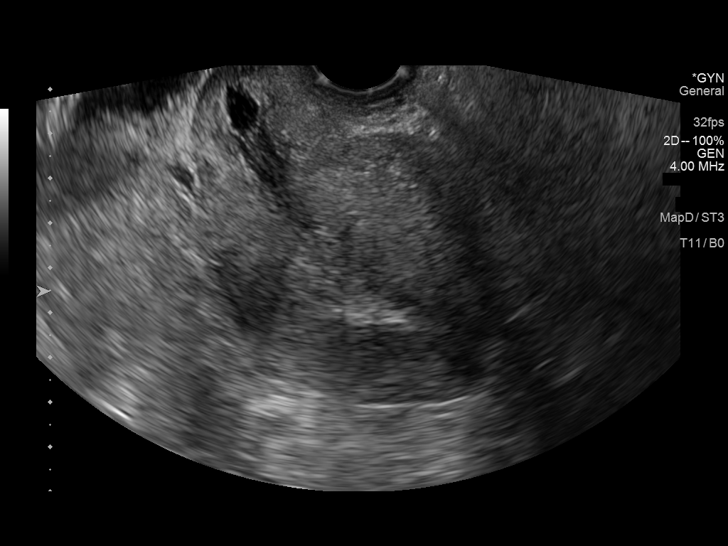
[im 11/18]
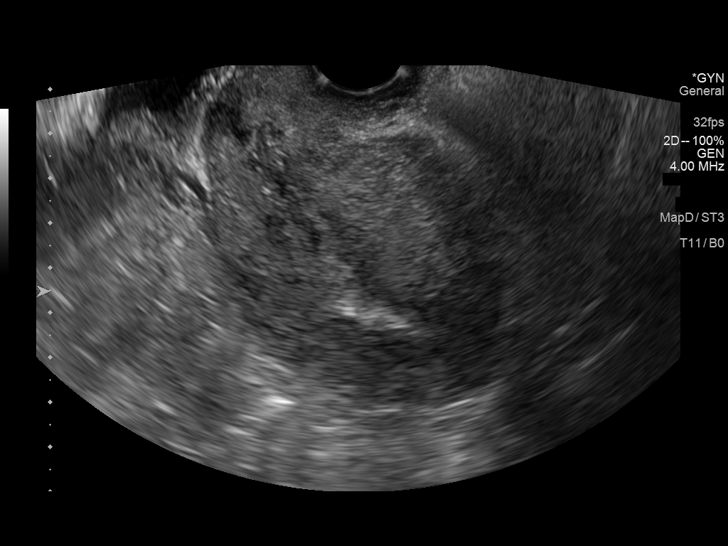
[im 13/18]
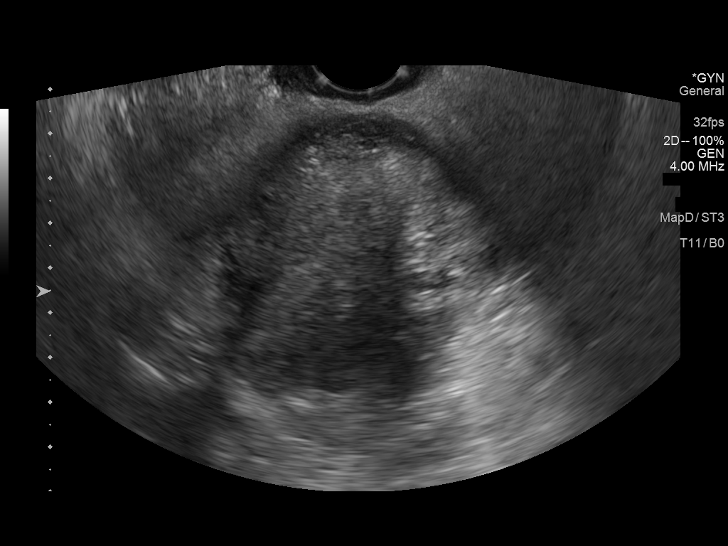
[im 14/18]
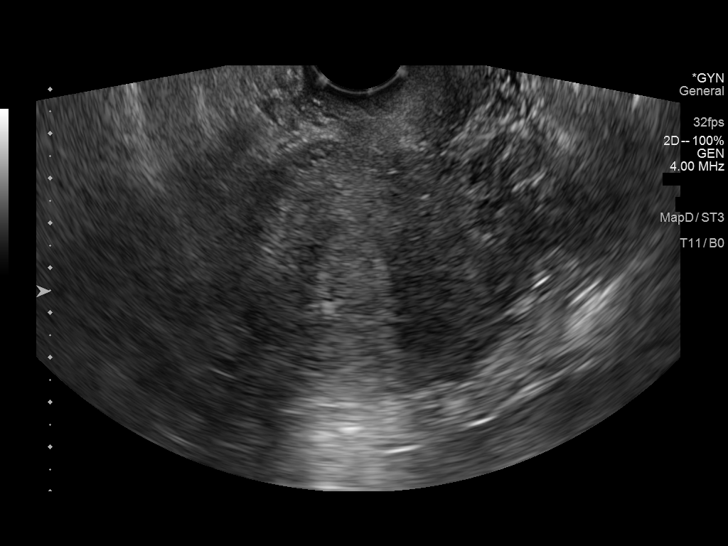
[im 15/18]
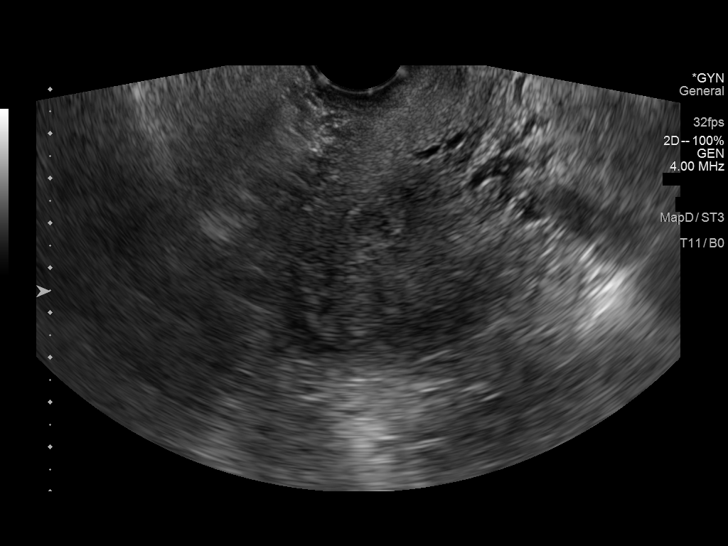
[im 17/18]
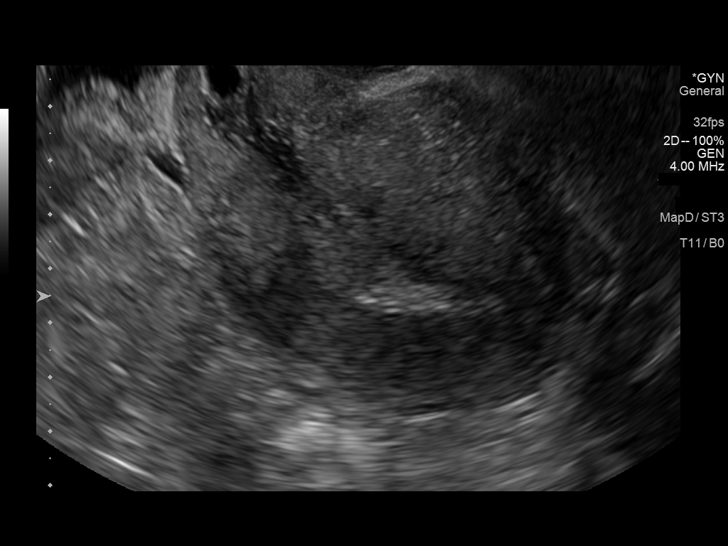
[im 18/18]
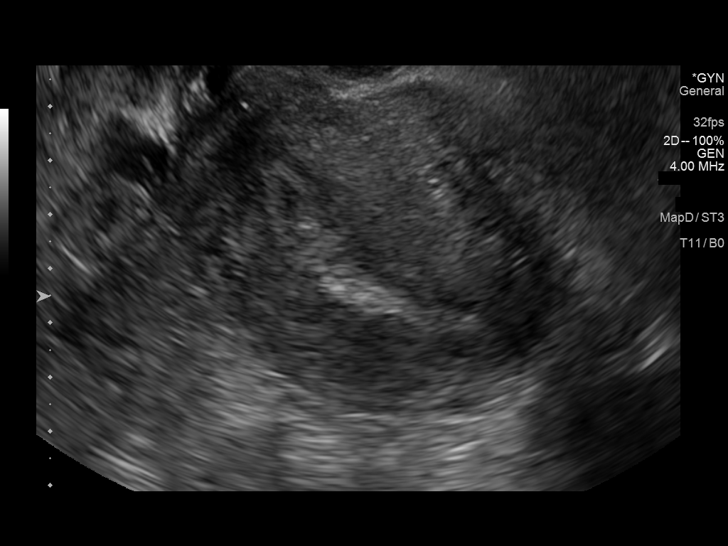

[14 of 18 positions shown; findings below may reference images not displayed]

FINDINGS: Uterus: Measures 9.2 x 6.3 x 6.8 cm. Uterine fibroids are again
noted. See report from 10/12/2020 for further details.

Endometrium: Measures 11.4 mm in thickness. Previously this was
measured at 24 mm.
IMPRESSION: 1. Limited transvaginal sonogram of the pelvis was performed prior
to scheduled sonohysterogram. Unfortunately, patient discomfort and
anatomy precluded sonohysterogram. Findings were discussed with the
referring clinician, Dr. Lhowari Ya, on 11/15/2020.

## 2022-04-14 NOTE — Progress Notes (Unsigned)
Patient ID: Eileen Todd, female    DOB: 1985-11-02  MRN: [id number]  CC: No chief complaint on file.   Subjective: Eileen Todd is a 37 y.o. female who presents for  Her concerns today include:  Mental health   Patient Active Problem List   Diagnosis Date Noted   Other iron deficiency anemias 12/25/2020   Symptomatic anemia 08/09/2020   Vaginal bleeding 08/09/2020   PCOS (polycystic ovarian syndrome) 08/09/2020   Acute cholecystitis due to biliary calculus 12/17/2019     Current Outpatient Medications on File Prior to Visit  Medication Sig Dispense Refill   Elagolix Sodium (ORILISSA) 200 MG TABS Take 200 mg by mouth.     ferrous sulfate 325 (65 FE) MG tablet Take 1 tablet (325 mg total) by mouth daily with breakfast. 30 tablet 0   ibuprofen (ADVIL) 800 MG tablet Take 1 tablet (800 mg total) by mouth every 8 (eight) hours as needed. 30 tablet 5   letrozole (FEMARA) 2.5 MG tablet Take 2.5 mg by mouth daily.     metFORMIN (GLUCOPHAGE-XR) 500 MG 24 hr tablet TAKE 3 TABLETS (1,500 MG TOTAL) BY MOUTH DAILY AFTER SUPPER. 270 tablet 3   ondansetron (ZOFRAN-ODT) 8 MG disintegrating tablet Take 1 tablet (8 mg total) by mouth every 8 (eight) hours as needed for nausea or vomiting. 20 tablet 0   Prenatal Vit-Fe Fumarate-FA (PRENATAL VITAMINS) 28-0.8 MG TABS TAKE 1 TABLET BY MOUTH EVERY DAY BEFORE BREAKFAST 90 tablet 3   vitamin B-12 (CYANOCOBALAMIN) 1000 MCG tablet Take 2,000 mcg by mouth daily.     No current facility-administered medications on file prior to visit.    No Known Allergies  Social History   Socioeconomic History   Marital status: Married    Spouse name: Not on file   Number of children: Not on file   Years of education: Not on file   Highest education level: Not on file  Occupational History   Not on file  Tobacco Use   Smoking status: Never   Smokeless tobacco: Never  Vaping Use   Vaping Use: Never used  Substance and Sexual Activity   Alcohol use: Never    Drug use: Never   Sexual activity: Yes    Partners: Male  Other Topics Concern   Not on file  Social History Narrative   Not on file   Social Determinants of Health   Financial Resource Strain: Not on file  Food Insecurity: Not on file  Transportation Needs: Not on file  Physical Activity: Not on file  Stress: Not on file  Social Connections: Not on file  Intimate Partner Violence: Not on file    No family history on file.  Past Surgical History:  Procedure Laterality Date   CHOLECYSTECTOMY N/A 12/18/2019   Procedure: LAPAROSCOPIC CHOLECYSTECTOMY;  Surgeon: Kinsinger, Arta Bruce, MD;  Location: [facility];  Service: General;  Laterality: N/A;   DILATION AND CURETTAGE OF UTERUS     WISDOM TOOTH EXTRACTION      ROS: Review of Systems Negative except as stated above  PHYSICAL EXAM: There were no vitals taken for this visit.  Physical Exam  {female adult master:310786} {female adult master:310785}     Latest Ref Rng & Units 08/09/2020    5:40 PM 12/17/2019   10:06 AM  CMP  Glucose 70 - 99 mg/dL 145  93   BUN 6 - 20 mg/dL 11  10   Creatinine 0.44 - 1.00 mg/dL 0.71  0.61  Sodium 135 - 145 mmol/L 143  137   Potassium 3.5 - 5.1 mmol/L 3.9  4.1   Chloride 98 - 111 mmol/L 111  102   CO2 22 - 32 mmol/L 23  26   Calcium 8.9 - 10.3 mg/dL 9.5  9.3   Total Protein 6.5 - 8.1 g/dL  7.2   Total Bilirubin 0.3 - 1.2 mg/dL  0.4   Alkaline Phos 38 - 126 U/L  74   AST 15 - 41 U/L  21   ALT 0 - 44 U/L  37    Lipid Panel  No results found for: "CHOL", "TRIG", "HDL", "CHOLHDL", "VLDL", "LDLCALC", "LDLDIRECT"  CBC    Component Value Date/Time   WBC 7.7 10/09/2020 1311   WBC 7.3 08/10/2020 0915   RBC 4.56 10/09/2020 1311   RBC 3.74 (L) 08/10/2020 0915   HGB 10.8 (L) 10/09/2020 1311   HCT 35.7 10/09/2020 1311   PLT 308 10/09/2020 1311   MCV 78 (L) 10/09/2020 1311   MCH 23.7 (L) 10/09/2020 1311   MCH 21.4 (L) 08/10/2020 0915   MCHC 30.3 (L) 10/09/2020 1311   MCHC 28.0 (L)  08/10/2020 0915   RDW 16.8 (H) 10/09/2020 1311   LYMPHSABS 1.4 08/09/2020 1740   LYMPHSABS 1.6 08/09/2020 1240   MONOABS 0.4 08/09/2020 1740   EOSABS 0.1 08/09/2020 1740   EOSABS 0.1 08/09/2020 1240   BASOSABS 0.0 08/09/2020 1740   BASOSABS 0.0 08/09/2020 1240    ASSESSMENT AND PLAN:  There are no diagnoses linked to this encounter.   Patient was given the opportunity to ask questions.  Patient verbalized understanding of the plan and was able to repeat key elements of the plan. Patient was given clear instructions to go to Emergency Department or return to medical center if symptoms don't improve, worsen, or new problems develop.The patient verbalized understanding.   No orders of the defined types were placed in this encounter.    Requested Prescriptions    No prescriptions requested or ordered in this encounter    No follow-ups on file.  [name], NP

## 2022-04-15 ENCOUNTER — Ambulatory Visit: Payer: BC Managed Care – PPO | Admitting: Family

## 2022-04-15 ENCOUNTER — Encounter: Payer: Self-pay | Admitting: Family

## 2022-04-15 ENCOUNTER — Telehealth: Payer: Self-pay | Admitting: Family

## 2022-04-15 VITALS — BP 126/84 | HR 92 | Temp 98.3°F | Resp 16 | Ht 67.01 in | Wt 240.0 lb

## 2022-04-15 DIAGNOSIS — F32A Depression, unspecified: Secondary | ICD-10-CM

## 2022-04-15 DIAGNOSIS — Z23 Encounter for immunization: Secondary | ICD-10-CM | POA: Diagnosis not present

## 2022-04-15 DIAGNOSIS — F419 Anxiety disorder, unspecified: Secondary | ICD-10-CM | POA: Diagnosis not present

## 2022-04-15 MED ORDER — HYDROXYZINE PAMOATE 25 MG PO CAPS: 25.0000 mg | ORAL_CAPSULE | Freq: Three times a day (TID) | ORAL | 1 refills | Status: DC | PRN

## 2022-04-15 MED ORDER — ESCITALOPRAM OXALATE 10 MG PO TABS: 10.0000 mg | ORAL_TABLET | Freq: Every day | ORAL | 0 refills | Status: DC

## 2022-04-15 NOTE — Progress Notes (Signed)
.  Pt presents for anxiety and depression

## 2022-04-15 NOTE — Patient Instructions (Signed)
Generalized Anxiety Disorder, Adult ?Generalized anxiety disorder (GAD) is a mental health condition. Unlike normal worries, anxiety related to GAD is not triggered by a specific event. These worries do not fade or get better with time. GAD interferes with relationships, work, and school. ?GAD symptoms can vary from mild to severe. People with severe GAD can have intense waves of anxiety with physical symptoms that are similar to panic attacks. ?What are the causes? ?The exact cause of GAD is not known, but the following are believed to have an impact: ?Differences in natural brain chemicals. ?Genes passed down from parents to children. ?Differences in the way threats are perceived. ?Development and stress during childhood. ?Personality. ?What increases the risk? ?The following factors may make you more likely to develop this condition: ?Being female. ?Having a family history of anxiety disorders. ?Being very shy. ?Experiencing very stressful life events, such as the death of a loved one. ?Having a very stressful family environment. ?What are the signs or symptoms? ?People with GAD often worry excessively about many things in their lives, such as their health and family. Symptoms may also include: ?Mental and emotional symptoms: ?Worrying excessively about natural disasters. ?Fear of being late. ?Difficulty concentrating. ?Fears that others are judging your performance. ?Physical symptoms: ?Fatigue. ?Headaches, muscle tension, muscle twitches, trembling, or feeling shaky. ?Feeling like your heart is pounding or beating very fast. ?Feeling out of breath or like you cannot take a deep breath. ?Having trouble falling asleep or staying asleep, or experiencing restlessness. ?Sweating. ?Nausea, diarrhea, or irritable bowel syndrome (IBS). ?Behavioral symptoms: ?Experiencing erratic moods or irritability. ?Avoidance of new situations. ?Avoidance of people. ?Extreme difficulty making decisions. ?How is this diagnosed? ?This  condition is diagnosed based on your symptoms and medical history. You will also have a physical exam. Your health care provider may perform tests to rule out other possible causes of your symptoms. ?To be diagnosed with GAD, a person must have anxiety that: ?Is out of his or her control. ?Affects several different aspects of his or her life, such as work and relationships. ?Causes distress that makes him or her unable to take part in normal activities. ?Includes at least three symptoms of GAD, such as restlessness, fatigue, trouble concentrating, irritability, muscle tension, or sleep problems. ?Before your health care provider can confirm a diagnosis of GAD, these symptoms must be present more days than they are not, and they must last for 6 months or longer. ?How is this treated? ?This condition may be treated with: ?Medicine. Antidepressant medicine is usually prescribed for long-term daily control. Anti-anxiety medicines may be added in severe cases, especially when panic attacks occur. ?Talk therapy (psychotherapy). Certain types of talk therapy can be helpful in treating GAD by providing support, education, and guidance. Options include: ?Cognitive behavioral therapy (CBT). People learn coping skills and self-calming techniques to ease their physical symptoms. They learn to identify unrealistic thoughts and behaviors and to replace them with more appropriate thoughts and behaviors. ?Acceptance and commitment therapy (ACT). This treatment teaches people how to be mindful as a way to cope with unwanted thoughts and feelings. ?Biofeedback. This process trains you to manage your body's response (physiological response) through breathing techniques and relaxation methods. You will work with a therapist while machines are used to monitor your physical symptoms. ?Stress management techniques. These include yoga, meditation, and exercise. ?A mental health specialist can help determine which treatment is best for you.  Some people see improvement with one type of therapy. However, other people require   a combination of therapies. Follow these instructions at home: Lifestyle Maintain a consistent routine and schedule. Anticipate stressful situations. Create a plan and allow extra time to work with your plan. Practice stress management or self-calming techniques that you have learned from your therapist or your health care provider. Exercise regularly and spend time outdoors. Eat a healthy diet that includes plenty of vegetables, fruits, whole grains, low-fat dairy products, and lean protein. Do not eat a lot of foods that are high in fat, added sugar, or salt (sodium). Drink plenty of water. Avoid alcohol. Alcohol can increase anxiety. Avoid caffeine and certain over-the-counter cold medicines. These may make you feel worse. Ask your pharmacist which medicines to avoid. General instructions Take over-the-counter and prescription medicines only as told by your health care provider. Understand that you are likely to have setbacks. Accept this and be kind to yourself as you persist to take better care of yourself. Anticipate stressful situations. Create a plan and allow extra time to work with your plan. Recognize and accept your accomplishments, even if you judge them as small. Spend time with people who care about you. Keep all follow-up visits. This is important. Where to find more information [place]: https://carter.com/ Substance Abuse and Mental Health Services: ktimeonline.com Contact a health care provider if: Your symptoms do not get better. Your symptoms get worse. You have signs of depression, such as: A persistently sad or irritable mood. Loss of enjoyment in activities that used to bring you joy. Change in weight or eating. Changes in sleeping habits. Get help right away if: You have thoughts about hurting yourself or others. If you ever feel like you may hurt  yourself or others, or have thoughts about taking your own life, get help right away. Go to your nearest emergency department or: Call your local emergency services (911 in the [place] Call a suicide crisis helpline, such as the [facility] at 478-248-0503 or 988 in the [place]. This is open 24 hours a day in the [place] Text the Crisis Text Line at 678 497 7016 (in the [facility].). Summary Generalized anxiety disorder (GAD) is a mental health condition that involves worry that is not triggered by a specific event. People with GAD often worry excessively about many things in their lives, such as their health and family. GAD may cause symptoms such as restlessness, trouble concentrating, sleep problems, frequent sweating, nausea, diarrhea, headaches, and trembling or muscle twitching. A mental health specialist can help determine which treatment is best for you. Some people see improvement with one type of therapy. However, other people require a combination of therapies. This information is not intended to replace advice given to you by your health care provider. Make sure you discuss any questions you have with your health care provider. Document Revised: 10/17/2020 Document Reviewed: 07/15/2020 Elsevier Patient Education  [place].  Depression Screening Depression screening is a tool that your health care provider can use to learn if you have symptoms of depression. Depression is a common condition with many symptoms that are also often found in other conditions. Depression is treatable, but it must first be diagnosed. You may not know that certain feelings, thoughts, and behaviors that you are having can be symptoms of depression. Taking a depression screening test can help you and your health care provider decide if you need more assessment, or if you should be referred to a mental health care provider. What are the screening tests? You may have a physical  exam to see if another  condition is affecting your mental health. You may have a blood or urine sample taken during the physical exam. You may be interviewed or offered a written test using a screening tool that was developed from research, such as one of these: Patient Health Questionnaire (PHQ). This is a set of either 2 or 9 questions. A health care provider who has been trained to score this screening test uses a guide to assess if your symptoms suggest that you may have depression. Hamilton Depression Rating Scale (HAM-D). This is a set of either 17 or 24 questions. You may be asked to take it again during or after your treatment, to see if your depression has gotten better. Beck Depression Inventory (BDI). This is a set of 21 multiple choice questions. Your health care provider scores your answers to assess: Your level of depression, ranging from mild to severe. Your response to treatment. Your health care provider may talk with you about your daily activities, such as eating, sleeping, work, and recreation, and ask if you have had any changes in activity. Your health care provider may ask you to see a mental health specialist, such as a psychiatrist or psychologist, for more evaluation. Who should be screened for depression?  All adults, including adults with a family history of a mental health disorder. People who are 79-67 years old. People who are recovering from an acute condition, such as myocardial infarction (MI) or stroke. Pregnant women, or women who have given birth. People who have a long-term (chronic) illness. Anyone who has been diagnosed with another type of mental health disorder. Anyone who has symptoms that could show depression. What do my results mean? Your health care provider will review the results of your depression screening, physical exam, and lab tests. Positive screens suggest that you may have depression. Screening is the first step in getting the care that you may need. It will be  important for you to know the results of your tests. Ask your health care provider, or the department that is doing your screening tests, when your results will be ready. Talk with your health care provider about your results, diagnosis, and recommendations for follow-up. A diagnosis of depression is made using information from the Diagnostic and Statistical Manual of Mental Disorders (DSM-5). This is a book that lists the number and type of symptoms that must be present for a health care provider to give a specific diagnosis. Your health care provider may work with you to treat your symptoms of depression, or your health care provider may help you find a mental health provider who can assess and help you develop a plan to treat your depression. Get help right away if: You have thoughts about hurting yourself or others. If you ever feel like you may hurt yourself or others, or have thoughts about taking your own life, get help right away. Go to your nearest emergency department or: Call your local emergency services (911 in the [place] Call a suicide crisis helpline, such as the [facility] at (782) 075-9041 or 988 in the [facility]. This is open 24 hours a day in the [place] Text the Crisis Text Line at 838-244-8277 (in the [place].). Summary Depression screening is the first step in getting the help that you may need. If your screening test shows symptoms of depression (is positive), your health care provider may ask you to see a mental health provider who will help identify ways to treat your depression.  Anyone aged 24 or older should be screened for depression. This information is not intended to replace advice given to you by your health care provider. Make sure you discuss any questions you have with your health care provider. Document Revised: 10/17/2020 Document Reviewed: 07/02/2020 Elsevier Patient Education  [place].

## 2022-04-24 DIAGNOSIS — M9903 Segmental and somatic dysfunction of lumbar region: Secondary | ICD-10-CM | POA: Diagnosis not present

## 2022-04-24 DIAGNOSIS — M9901 Segmental and somatic dysfunction of cervical region: Secondary | ICD-10-CM | POA: Diagnosis not present

## 2022-04-24 DIAGNOSIS — M9902 Segmental and somatic dysfunction of thoracic region: Secondary | ICD-10-CM | POA: Diagnosis not present

## 2022-04-24 DIAGNOSIS — M9905 Segmental and somatic dysfunction of pelvic region: Secondary | ICD-10-CM | POA: Diagnosis not present

## 2022-04-25 DIAGNOSIS — M9905 Segmental and somatic dysfunction of pelvic region: Secondary | ICD-10-CM | POA: Diagnosis not present

## 2022-04-25 DIAGNOSIS — M9903 Segmental and somatic dysfunction of lumbar region: Secondary | ICD-10-CM | POA: Diagnosis not present

## 2022-04-25 DIAGNOSIS — M9906 Segmental and somatic dysfunction of lower extremity: Secondary | ICD-10-CM | POA: Diagnosis not present

## 2022-04-25 DIAGNOSIS — M9902 Segmental and somatic dysfunction of thoracic region: Secondary | ICD-10-CM | POA: Diagnosis not present

## 2022-04-25 DIAGNOSIS — M9901 Segmental and somatic dysfunction of cervical region: Secondary | ICD-10-CM | POA: Diagnosis not present

## 2022-05-02 DIAGNOSIS — M9902 Segmental and somatic dysfunction of thoracic region: Secondary | ICD-10-CM | POA: Diagnosis not present

## 2022-05-02 DIAGNOSIS — M9906 Segmental and somatic dysfunction of lower extremity: Secondary | ICD-10-CM | POA: Diagnosis not present

## 2022-05-02 DIAGNOSIS — M9905 Segmental and somatic dysfunction of pelvic region: Secondary | ICD-10-CM | POA: Diagnosis not present

## 2022-05-02 DIAGNOSIS — M9901 Segmental and somatic dysfunction of cervical region: Secondary | ICD-10-CM | POA: Diagnosis not present

## 2022-05-02 DIAGNOSIS — M9903 Segmental and somatic dysfunction of lumbar region: Secondary | ICD-10-CM | POA: Diagnosis not present

## 2022-05-02 NOTE — Telephone Encounter (Signed)
I called patient today to introduce [employer] and to assess patients' mental health needs. Patient did not answer the phone. I  was able to leave a brief message with the patient asking them to return the call. Patient was referred by PCP for depression and anxiety

## 2022-05-12 NOTE — Progress Notes (Unsigned)
Patient ID: MARENE GILLIAM, female    DOB: October 23, 1985  MRN: [id number]  CC: Anxiety Depression Follow-Up  Subjective: Geniyah Eischeid is a 37 y.o. female who presents for anxiety depression follow-up.   Her concerns today include:  Reports she is doing well on Lexapro and Hydroxyzine, no issues/concerns. She denies thoughts of self-harm, suicidal ideations, and homicidal ideations. She has an upcoming appointment with [facility] on 06/16/2022. Today she has a Notice of Eligibility & Rights and Responsibilities under the Family and Medical Leave Act form for completion. 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 States she is requesting FMLA to be out of work beginning 05/13/2022 and to return on 06/16/2022. Discussed with patient in detail that the form she has for completion on today does not have the healthcare provider portion of the form attached for completion. She plans to bring the same back for completion by primary provider at later date.     Patient Active Problem List   Diagnosis Date Noted   Anxiety and depression 04/15/2022   Endometriosis determined by laparoscopy 03/26/2021   Other iron deficiency anemias 12/25/2020   Symptomatic anemia 08/09/2020   Vaginal bleeding 08/09/2020   PCOS (polycystic ovarian syndrome) 08/09/2020   Acute cholecystitis due to biliary calculus 12/17/2019     Current Outpatient Medications on File Prior to Visit  Medication Sig Dispense Refill   ibuprofen (ADVIL) 800 MG tablet Take 1 tablet (800 mg total) by mouth every 8 (eight) hours as needed. 30 tablet 5   metFORMIN (GLUCOPHAGE-XR) 500 MG 24 hr tablet TAKE 3 TABLETS (1,500 MG TOTAL) BY MOUTH DAILY AFTER SUPPER. 270 tablet 3   Prenatal Vit-Fe Fumarate-FA (PRENATAL VITAMINS) 28-0.8 MG TABS TAKE 1 TABLET BY MOUTH EVERY DAY BEFORE BREAKFAST 90 tablet 3   vitamin B-12 (CYANOCOBALAMIN) 1000 MCG tablet Take 2,000 mcg by mouth daily.     No current  facility-administered medications on file prior to visit.    No Known Allergies  Social History   Socioeconomic History   Marital status: Married    Spouse name: Not on file   Number of children: Not on file   Years of education: Not on file   Highest education level: Not on file  Occupational History   Not on file  Tobacco Use   Smoking status: Never    Passive exposure: Never   Smokeless tobacco: Never  Vaping Use   Vaping Use: Never used  Substance and Sexual Activity   Alcohol use: Never   Drug use: Never   Sexual activity: Yes    Partners: Male  Other Topics Concern   Not on file  Social History Narrative   Not on file   Social Determinants of Health   Financial Resource Strain: Not on file  Food Insecurity: Not on file  Transportation Needs: Not on file  Physical Activity: Not on file  Stress: Not on file  Social Connections: Not on file  Intimate Partner Violence: Not on file    No family history on file.  Past Surgical History:  Procedure Laterality Date   CHOLECYSTECTOMY N/A 12/18/2019   Procedure: LAPAROSCOPIC CHOLECYSTECTOMY;  Surgeon: Kinsinger, Arta Bruce, MD;  Location: [facility];  Service: General;  Laterality: N/A;   DILATION AND CURETTAGE OF UTERUS     WISDOM TOOTH EXTRACTION      ROS: Review of Systems Negative except as stated above  PHYSICAL EXAM: BP 118/79 (BP Location: Left Arm, Patient  Position: Sitting, Cuff Size: Large)   Pulse 79   Temp 98.3 F (36.8 C)   Resp 16   Ht 5' 7.01" (1.702 m)   Wt 264 lb (119.7 kg)   SpO2 96%   BMI 41.34 kg/m   Physical Exam HENT:     Head: Normocephalic and atraumatic.  Eyes:     Extraocular Movements: Extraocular movements intact.     Conjunctiva/sclera: Conjunctivae normal.     Pupils: Pupils are equal, round, and reactive to light.  Cardiovascular:     Rate and Rhythm: Normal rate and regular rhythm.     Pulses: Normal pulses.     Heart sounds: Normal heart sounds.  Pulmonary:      Effort: Pulmonary effort is normal.     Breath sounds: Normal breath sounds.  Musculoskeletal:     Cervical back: Normal range of motion and neck supple.  Neurological:     General: No focal deficit present.     Mental Status: She is alert and oriented to person, place, and time.  Psychiatric:        Mood and Affect: Mood normal.        Behavior: Behavior normal.      ASSESSMENT AND PLAN: 1. Anxiety and depression - Patient denies thoughts of self-harm, suicidal ideations, homicidal ideations. - Continue Escitalopram and Hydroxyzine as prescribed.  - Keep all scheduled appointments with [facility]. During the interim follow-up with primary provider as scheduled. - escitalopram (LEXAPRO) 10 MG tablet; Take 1 tablet (10 mg total) by mouth at bedtime.  Dispense: 30 tablet; Refill: 2 - hydrOXYzine (VISTARIL) 25 MG capsule; Take 1 capsule (25 mg total) by mouth every 8 (eight) hours as needed.  Dispense: 30 capsule; Refill: 2  2. Encounter for completion of form with patient - Patient to bring back healthcare provider portion of Notice of Eligibility & Rights and Responsibilities under the Family and Medical Leave Act form for completion at later date.  - Discussed with patient in detail once established with [facility] their office will complete any subsequent FMLA paperwork. Patient verbalized understanding and agreement.   Patient was given the opportunity to ask questions.  Patient verbalized understanding of the plan and was able to repeat key elements of the plan. Patient was given clear instructions to go to Emergency Department or return to medical center if symptoms don't improve, worsen, or new problems develop.The patient verbalized understanding.    Requested Prescriptions   Signed Prescriptions Disp Refills   escitalopram (LEXAPRO) 10 MG tablet 30 tablet 2    Sig: Take 1 tablet (10 mg total) by mouth at bedtime.   hydrOXYzine (VISTARIL) 25 MG capsule 30 capsule  2    Sig: Take 1 capsule (25 mg total) by mouth every 8 (eight) hours as needed.    Follow-up with primary provider as scheduled.   [name], NP

## 2022-05-13 ENCOUNTER — Ambulatory Visit: Payer: BC Managed Care – PPO | Admitting: Family

## 2022-05-13 ENCOUNTER — Encounter: Payer: Self-pay | Admitting: Family

## 2022-05-13 VITALS — BP 118/79 | HR 79 | Temp 98.3°F | Resp 16 | Ht 67.01 in | Wt 264.0 lb

## 2022-05-13 DIAGNOSIS — Z6841 Body Mass Index (BMI) 40.0 and over, adult: Secondary | ICD-10-CM

## 2022-05-13 DIAGNOSIS — F32A Depression, unspecified: Secondary | ICD-10-CM

## 2022-05-13 DIAGNOSIS — Z0289 Encounter for other administrative examinations: Secondary | ICD-10-CM

## 2022-05-13 DIAGNOSIS — E669 Obesity, unspecified: Secondary | ICD-10-CM | POA: Diagnosis not present

## 2022-05-13 DIAGNOSIS — F419 Anxiety disorder, unspecified: Secondary | ICD-10-CM | POA: Diagnosis not present

## 2022-05-13 MED ORDER — ESCITALOPRAM OXALATE 10 MG PO TABS: 10.0000 mg | ORAL_TABLET | Freq: Every day | ORAL | 2 refills | Status: DC

## 2022-05-13 MED ORDER — HYDROXYZINE PAMOATE 25 MG PO CAPS: 25.0000 mg | ORAL_CAPSULE | Freq: Three times a day (TID) | ORAL | 2 refills | Status: AC | PRN

## 2022-05-13 NOTE — Progress Notes (Signed)
Pt presents for anxiety and depression  -has FMLA form for provider completion  -needs refills on Escitalopram and Hydroxyzine

## 2022-05-15 ENCOUNTER — Telehealth: Payer: Self-pay | Admitting: Family

## 2022-05-19 DIAGNOSIS — M9907 Segmental and somatic dysfunction of upper extremity: Secondary | ICD-10-CM | POA: Diagnosis not present

## 2022-05-19 DIAGNOSIS — M9901 Segmental and somatic dysfunction of cervical region: Secondary | ICD-10-CM | POA: Diagnosis not present

## 2022-05-19 DIAGNOSIS — M9903 Segmental and somatic dysfunction of lumbar region: Secondary | ICD-10-CM | POA: Diagnosis not present

## 2022-05-19 DIAGNOSIS — M9902 Segmental and somatic dysfunction of thoracic region: Secondary | ICD-10-CM | POA: Diagnosis not present

## 2022-05-19 DIAGNOSIS — M9905 Segmental and somatic dysfunction of pelvic region: Secondary | ICD-10-CM | POA: Diagnosis not present

## 2022-06-06 ENCOUNTER — Other Ambulatory Visit: Payer: Self-pay | Admitting: Family

## 2022-06-06 DIAGNOSIS — F419 Anxiety disorder, unspecified: Secondary | ICD-10-CM

## 2022-06-09 NOTE — Telephone Encounter (Signed)
Unable to refill per protocol, Rx request is too soon. Last refill 05/13/22 for 30 and 2 refills.  Requested Prescriptions  Pending Prescriptions Disp Refills   escitalopram (LEXAPRO) 10 MG tablet [Pharmacy Med Name: ESCITALOPRAM 10 MG TABLET] 90 tablet 1    Sig: TAKE 1 TABLET BY MOUTH EVERYDAY AT BEDTIME     Psychiatry:  Antidepressants - SSRI Passed - 06/06/2022 12:31 PM      Passed - Completed PHQ-2 or PHQ-9 in the last 360 days      Passed - Valid encounter within last 6 months    Recent Outpatient Visits           3 weeks ago Anxiety and depression   [place] [facility], [place], NP   1 month ago Anxiety and depression   [place] [facility], [place], NP   1 year ago Elevated serum GGT level   [place] [facility], [place], NP   1 year ago Encounter to establish care   [facility] at [facility], [place], NP

## 2022-06-16 ENCOUNTER — Ambulatory Visit (HOSPITAL_BASED_OUTPATIENT_CLINIC_OR_DEPARTMENT_OTHER): Payer: BC Managed Care – PPO | Admitting: Psychiatry

## 2022-06-16 ENCOUNTER — Encounter (HOSPITAL_COMMUNITY): Payer: Self-pay | Admitting: Psychiatry

## 2022-06-16 VITALS — BP 148/81 | HR 93 | Ht 67.0 in | Wt 278.0 lb

## 2022-06-16 DIAGNOSIS — F419 Anxiety disorder, unspecified: Secondary | ICD-10-CM

## 2022-06-16 DIAGNOSIS — F32A Depression, unspecified: Secondary | ICD-10-CM | POA: Diagnosis not present

## 2022-06-16 MED ORDER — BUPROPION HCL ER (XL) 150 MG PO TB24: 150.0000 mg | ORAL_TABLET | ORAL | 2 refills | Status: DC

## 2022-06-16 NOTE — Progress Notes (Signed)
Psychiatric Initial Adult Assessment   Patient Identification: Eileen Todd MRN:  [id number] Date of Evaluation:  06/16/2022 Referral Source: PCP Chief Complaint:   Chief Complaint  Patient presents with   Establish Care   New Patient (Initial Visit)   Visit Diagnosis:    ICD-10-CM   1. Anxiety and depression  F41.9 buPROPion (WELLBUTRIN XL) 150 MG 24 hr tablet   F32.A        Assessment:  Eileen Todd is a 37 y.o. female with a history of anxiety, depression, PCOS, and endometriosis who presents in person to [place] [facility] for initial evaluation on 06/16/2022.    Patient reports neurovegetative symptoms of depression including low mood, anhedonia, worthlessness, poor concentration, disturbed sleep, and decreased appetite.  She denies any SI or thoughts of self-harm.  Furthermore patient did endorse symptoms of anxiety including constant worry she is unable to control, restlessness, irritability, difficulty relaxing, and fears that something awful might happen.  Patient does have a history of past trauma though denies symptoms consistent with PTSD.  She also carries a diagnosis of PCOS and endometriosis.  Patient meets criteria for MDD and GAD. She has had some improvement in her symptoms since starting medications though has not returned to her baseline and could benefit from further medication adjustment in addition to connection with therapy.  A number of assessments were performed during the evaluation today including  PHQ-9 which they scored a 9 on, GAD-7 which they scored a 12 on, and [place] suicide severity screening which showed no risk.  Based on these assessments patient would benefit from medication adjustment to better target their symptoms.  Plan: - Continue Lexapro 10 mg QD - Start Wellbutrin Xl 150 mg QD - Continue Atarax 25 mg TID prn for anxiety - Dance therapy information provided - Crisis resources reviewed - Follow up in a  month  History of Present Illness: Eileen Todd presents reporting that she was referred by her PCP after experiencing increased depression a few months ago.  Patient notes that she has a long history of depression which have been managed intermittently through therapy and at one point medication, however these have been stopped for various reasons and she has never had long-term resolution of her symptoms.  The current episode of depression started a bit before December of last year and got worse over the course of the month.  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  Upon returning home she had difficulty adjusting her sleep schedule and later caught COVID.  The depression eventually reached the point of passive SI which really scared Eileen Todd convincing her to see her PCP.  Her PCP started her on Lexapro and Atarax in addition to referring her to psychiatry. Eileen Todd reports that the Lexapro has been helpful in improving her overall mood and sudden episodes of tearfulness.  While the Atarax has helped with the breakthrough anxiety and has not been needed as frequently.  She denies any notable side effects from either medication.  Eileen Todd however does not feel like she has returned to her baseline.  She describes feeling as if she is floating and still struggles with executive function, fatigue, amotivation most notably after getting home from work.  This there are a number of chores to be done around the house which she would normally complete without issue of her recently struggles to do them.  There are periods of  increased anxiety where her thoughts spiral and she needs to take time away to ground herself, in addition to periods of increased irritability where she can have a short temper.  GAD-7 and PHQ-9 were performed and showed some improvement compared to scores 1 month ago, though patient still did meet criteria for mild  depression and moderate anxiety.  Patient denied any symptoms consistent with mania or psychosis.  She does have a past history of emotional trauma after finding her sister deceased when she went to wake her up 1 day.  She had some symptoms of PTSD including nightmares and flashbacks in the past however they have decreased over the years.  Patient has had increased sadness during the anniversary of her death and birthday but has not noticed them as much the past year.  Also of note patient has a diagnosis of PCOS and endometriosis which she only takes metformin.  Treatment options were discussed including medications and therapy.  While patient has noticed good benefit from the Lexapro and the Atarax that she feels like there is further room for improvement.  We discussed options of increasing Lexapro and adding Wellbutrin.  Patient was most interested in adding Wellbutrin and risk and benefits were discussed.  Further titration of Lexapro can be considered in the future. Eileen Todd was also interested in therapy options.  She had been connected with therapy in the past however feels like she has run her course through talk therapy.  She was interested in alternative methods of therapy such as dance or art therapy.  She was provided with information for a dance therapist.   Associated Signs/Symptoms: Depression Symptoms:  depressed mood, anhedonia, fatigue, anxiety, loss of energy/fatigue, disturbed sleep, decreased appetite, (Hypo) Manic Symptoms:  Irritable Mood, Anxiety Symptoms:  Excessive Worry, Psychotic Symptoms:   Denies PTSD Symptoms: Had a traumatic exposure:  Finding her sister deceased when she went to wake her up  Past Psychiatric History:  Denies any prior psychiatric hospitalizations or suicide attempts.  Has been connected with therapy in the past though discontinued over time due to plateauing in her progress.  Has tried Celexa (SI) in the past. Currently on Lexapro and  Atarax  Denies any substance use including alcohol, marijuana, caffeine, or other substances. Had used caffeine in the past but it made her anxiety worse  Previous Psychotropic Medications: Yes   Substance Abuse History in the last 12 months:  No.  Consequences of Substance Abuse: NA  Past Medical History:  Past Medical History:  Diagnosis Date   Endometriosis    Family history of adverse reaction to anesthesia    PCOS (polycystic ovarian syndrome)     Past Surgical History:  Procedure Laterality Date   CHOLECYSTECTOMY N/A 12/18/2019   Procedure: LAPAROSCOPIC CHOLECYSTECTOMY;  Surgeon: Kinsinger, Arta Bruce, MD;  Location: [place];  Service: General;  Laterality: N/A;   DILATION AND CURETTAGE OF UTERUS     WISDOM TOOTH EXTRACTION      Family Psychiatric History: Has an aunt who has bipolar disorder and depression runs on her mother's side of the family.  Family History: No family history on file.  Social History:   Social History   Socioeconomic History   Marital status: Married    Spouse name: Not on file   Number of children: Not on file   Years of education: Not on file   Highest education level: Not on file  Occupational History   Not on file  Tobacco Use   Smoking status: Never  Passive exposure: Never   Smokeless tobacco: Never  Vaping Use   Vaping Use: Never used  Substance and Sexual Activity   Alcohol use: Never   Drug use: Never   Sexual activity: Yes    Partners: Male  Other Topics Concern   Not on file  Social History Narrative   Not on file   Social Determinants of Health   Financial Resource Strain: Not on file  Food Insecurity: Not on file  Transportation Needs: Not on file  Physical Activity: Not on file  Stress: Not on file  Social Connections: Not on file    Additional Social History: Patient lives with her husband and 2 dogs.  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  She recently left her last  job after her company was acquired and her new job description was not something she was as interested in.  Patient enjoys roller [place] and participates in it multiple days a week.  Her and her husband also help care for her father-in-law, brother-in-law, and mother-in-law.  The 2 of them are the power of attorney for the mother-in-law and had been busy with appointments up until recently.  Going forward her husband is going to take care of a bit more of this since Eileen Todd has returned to work  Allergies:  No Known Allergies  Metabolic Disorder Labs: No results found for: "HGBA1C", "MPG" No results found for: "PROLACTIN" No results found for: "CHOL", "TRIG", "HDL", "CHOLHDL", "VLDL", "LDLCALC" No results found for: "TSH"  Therapeutic Level Labs: No results found for: "LITHIUM" No results found for: "CBMZ" No results found for: "VALPROATE"  Current Medications: Current Outpatient Medications  Medication Sig Dispense Refill   buPROPion (WELLBUTRIN XL) 150 MG 24 hr tablet Take 1 tablet (150 mg total) by mouth every morning. 30 tablet 2   escitalopram (LEXAPRO) 10 MG tablet Take 1 tablet (10 mg total) by mouth at bedtime. 30 tablet 2   hydrOXYzine (VISTARIL) 25 MG capsule Take 1 capsule (25 mg total) by mouth every 8 (eight) hours as needed. 30 capsule 2   ibuprofen (ADVIL) 800 MG tablet Take 1 tablet (800 mg total) by mouth every 8 (eight) hours as needed. 30 tablet 5   metFORMIN (GLUCOPHAGE-XR) 500 MG 24 hr tablet TAKE 3 TABLETS (1,500 MG TOTAL) BY MOUTH DAILY AFTER SUPPER. 270 tablet 3   Prenatal Vit-Fe Fumarate-FA (PRENATAL VITAMINS) 28-0.8 MG TABS TAKE 1 TABLET BY MOUTH EVERY DAY BEFORE BREAKFAST 90 tablet 3   vitamin B-12 (CYANOCOBALAMIN) 1000 MCG tablet Take 2,000 mcg by mouth daily.     No current facility-administered medications for this visit.    Musculoskeletal: Strength & Muscle Tone: within normal limits Gait & Station: normal Patient leans: N/A  Psychiatric Specialty  Exam: Review of Systems  Blood pressure (!) 148/81, pulse 93, height '5\' 7"'$  (1.702 m), weight 278 lb (126.1 kg).Body mass index is 43.54 kg/m.  General Appearance: Well Groomed  Eye Contact:  Good  Speech:  Clear and Coherent  Volume:  Normal  Mood:  Anxious and Depressed  Affect:  Congruent  Thought Process:  Coherent, Goal Directed, and Linear  Orientation:  Full (Time, Place, and Person)  Thought Content:  Logical  Suicidal Thoughts:  No  Homicidal Thoughts:  No  Memory:  Immediate;   Good  Judgement:  Good  Insight:  Good  Psychomotor Activity:  Normal  Concentration:  Concentration: Good  Recall:  [place] of Knowledge:Good  Language:  Good  Akathisia:  NA    AIMS (if indicated):  not done  Assets:  Communication Skills Desire for Improvement Housing Talents/Skills Transportation Vocational/Educational  ADL's:  Intact  Cognition: WNL  Sleep:  Fair   Screenings: GAD-7    [place] [facility] Visit from [facility]/16/2022 [facility] Visit from 05/13/2022 in [place] [facility] [place] Visit from 04/15/2022 in [place] [facility] [place] Visit from 02/05/2021 in [place] at [facility]  Total GAD-7 Score '12 13 17 8      '$ PHQ2-9    [place] Visit from 06/16/2022 in [place] [facility] Office Visit from 05/13/2022 in [place] [facility] [place] Visit from 04/15/2022 in [place] [facility] [place] Visit from 02/05/2021 in [place] [facility] [place] Visit from 12/25/2020 in [place] [facility]  PHQ-2 Total Score '2 4 6 2 2  '$ PHQ-9 Total Score '9 20 23 5 10      '$ [place] Visit from 06/16/2022 in [place] [facility] ED from 05/06/2021 in [place] [facility] at [facility]) ED to  Hosp-Admission (Discharged) from 08/09/2020 in [place] Low Risk No Risk No Risk        Collaboration of Care: Medication Management AEB medication prescription and Primary Care Provider AEB chart review  65 minutes were spent in chart review, interview, psycho education, counseling, medical decision making, coordination of care and long-term prognosis.  Patient was given opportunity to ask question and all concerns and questions were addressed and answers. Excluding separately billable services.   Patient/Guardian was advised Release of Information must be obtained prior to any record release in order to collaborate their care with an outside provider. Patient/Guardian was advised if they have not already done so to contact the registration department to sign all necessary forms in order for [place] to release information regarding their care.   Consent: Patient/Guardian gives verbal consent for treatment and assignment of benefits for services provided during this visit. Patient/Guardian expressed understanding and agreed to proceed.   Vista Mink, MD 3/11/20244:01 PM

## 2022-07-09 ENCOUNTER — Other Ambulatory Visit (HOSPITAL_COMMUNITY): Payer: Self-pay | Admitting: Psychiatry

## 2022-07-09 DIAGNOSIS — F32A Depression, unspecified: Secondary | ICD-10-CM

## 2022-07-17 ENCOUNTER — Encounter (HOSPITAL_COMMUNITY): Payer: Self-pay | Admitting: Psychiatry

## 2022-07-17 ENCOUNTER — Telehealth (HOSPITAL_BASED_OUTPATIENT_CLINIC_OR_DEPARTMENT_OTHER): Payer: BC Managed Care – PPO | Admitting: Psychiatry

## 2022-07-17 DIAGNOSIS — F419 Anxiety disorder, unspecified: Secondary | ICD-10-CM | POA: Diagnosis not present

## 2022-07-17 DIAGNOSIS — F32A Depression, unspecified: Secondary | ICD-10-CM | POA: Diagnosis not present

## 2022-07-17 MED ORDER — ESCITALOPRAM OXALATE 10 MG PO TABS: 10.0000 mg | ORAL_TABLET | Freq: Every day | ORAL | 1 refills | Status: AC

## 2022-07-17 MED ORDER — BUPROPION HCL ER (XL) 150 MG PO TB24: 150.0000 mg | ORAL_TABLET | ORAL | 1 refills | Status: AC

## 2022-07-17 NOTE — Progress Notes (Signed)
BH MD/PA/NP OP Progress Note  07/17/2022 8:03 AM Eileen Todd  MRN:  [id number]  Visit Diagnosis:    ICD-10-CM   1. Anxiety and depression  F41.9    F32.A       Assessment: Eileen Todd is a 37 y.o. female with a history of anxiety, depression, PCOS, and endometriosis who presented to [facility] at [facility] for initial evaluation on 06/16/2022.    At initial evaluation patient reported neurovegetative symptoms of depression including low mood, anhedonia, worthlessness, poor concentration, disturbed sleep, and decreased appetite.  She denied any SI or thoughts of self-harm.  Furthermore patient did endorse symptoms of anxiety including constant worry she is unable to control, restlessness, irritability, difficulty relaxing, and fears that something awful might happen.  Patient does have a history of past trauma though denies symptoms consistent with PTSD.  She also carries a diagnosis of PCOS and endometriosis.  Patient met criteria for MDD and GAD.   Eileen Todd presents for follow-up evaluation. Today, 07/17/22, patient reports that her anxiety and depression have both been improving over the past month. She has noticed increased energy/motivation after starting Wellbutrin specifically in the latter half of the day. Patient denies any adverse side effects from the medication and we will follow up in 3 months.   Plan: - Continue Lexapro 10 mg QD - Continue Wellbutrin Xl 150 mg QD - Continue Atarax 25 mg TID prn for anxiety - Dance therapy information provided - Crisis resources reviewed - Follow up in 3 months  Chief Complaint:  Chief Complaint  Patient presents with   Follow-up   HPI: Eileen Todd presents reporting that the last month things seemed to have been going pretty well. She feels like she has more energy towards the end of the day and has been able to complete tasks during that time. This has in turn improved the low mood and feelings of worthlessness.  While she is not back at 100% she believes that she is close and moving in the right direction. She denies any adverse side effects from the medication. As for her anxiety patient has only needed to take the Atarax once over the past month. Eileen Todd has not reached out for therapy yet as she was sick with RSV the past few weeks and is just getting her voice back. She would like to continue on her current regimen at this time.   Past Psychiatric History: Denies any prior psychiatric hospitalizations or suicide attempts.  Has been connected with therapy in the past though discontinued over time due to plateauing in her progress.  Has tried Celexa (SI) in the past. Currently on Lexapro, Wellbutrin, and Atarax  Denies any substance use including alcohol, marijuana, caffeine, or other substances. Had used caffeine in the past but it made her anxiety worse  Past Medical History:  Past Medical History:  Diagnosis Date   Endometriosis    Family history of adverse reaction to anesthesia    PCOS (polycystic ovarian syndrome)     Past Surgical History:  Procedure Laterality Date   CHOLECYSTECTOMY N/A 12/18/2019   Procedure: LAPAROSCOPIC CHOLECYSTECTOMY;  Surgeon: Kinsinger, De Blanch, MD;  Location[facility];  Service: General;  Laterality: N/A;   DILATION AND CURETTAGE OF UTERUS     WISDOM TOOTH EXTRACTION      Family History: No family history on file.  Social History:  Social History   Socioeconomic History   Marital status: Married    Spouse name: Not on  file   Number of children: Not on file   Years of education: Not on file   Highest education level: Not on file  Occupational History   Not on file  Tobacco Use   Smoking status: Never    Passive exposure: Never   Smokeless tobacco: Never  Vaping Use   Vaping Use: Never used  Substance and Sexual Activity   Alcohol use: Never   Drug use: Never   Sexual activity: Yes    Partners: Male  Other Topics Concern   Not on file  Social  History Narrative   Not on file   Social Determinants of Health   Financial Resource Strain: Not on file  Food Insecurity: Not on file  Transportation Needs: Not on file  Physical Activity: Not on file  Stress: Not on file  Social Connections: Not on file    Allergies: No Known Allergies  Current Medications: Current Outpatient Medications  Medication Sig Dispense Refill   buPROPion (WELLBUTRIN XL) 150 MG 24 hr tablet Take 1 tablet (150 mg total) by mouth every morning. 30 tablet 2   escitalopram (LEXAPRO) 10 MG tablet Take 1 tablet (10 mg total) by mouth at bedtime. 30 tablet 2   hydrOXYzine (VISTARIL) 25 MG capsule Take 1 capsule (25 mg total) by mouth every 8 (eight) hours as needed. 30 capsule 2   ibuprofen (ADVIL) 800 MG tablet Take 1 tablet (800 mg total) by mouth every 8 (eight) hours as needed. 30 tablet 5   metFORMIN (GLUCOPHAGE-XR) 500 MG 24 hr tablet TAKE 3 TABLETS (1,500 MG TOTAL) BY MOUTH DAILY AFTER SUPPER. 270 tablet 3   Prenatal Vit-Fe Fumarate-FA (PRENATAL VITAMINS) 28-0.8 MG TABS TAKE 1 TABLET BY MOUTH EVERY DAY BEFORE BREAKFAST 90 tablet 3   vitamin B-12 (CYANOCOBALAMIN) 1000 MCG tablet Take 2,000 mcg by mouth daily.     No current facility-administered medications for this visit.     Psychiatric Specialty Exam: Review of Systems  There were no vitals taken for this visit.There is no height or weight on file to calculate BMI.  General Appearance: Well Groomed  Eye Contact:  Good  Speech:  Clear and Coherent  Volume:  Normal  Mood:  Euthymic  Affect:  Congruent  Thought Process:  Coherent and Goal Directed  Orientation:  Full (Time, Place, and Person)  Thought Content: Logical   Suicidal Thoughts:  No  Homicidal Thoughts:  No  Memory:  Immediate;   Good  Judgement:  Good  Insight:  Good  Psychomotor Activity:  Normal  Concentration:  Concentration: Good  Recall:  Good  Fund of Knowledge: Good  Language: Good  Akathisia:  NA    AIMS (if  indicated): not done  Assets:  Communication Skills Desire for Improvement Housing Talents/Skills Transportation Vocational/Educational  ADL's:  Intact  Cognition: WNL  Sleep:  Good   Metabolic Disorder Labs: No results found for: "HGBA1C", "MPG" No results found for: "PROLACTIN" No results found for: "CHOL", "TRIG", "HDL", "CHOLHDL", "VLDL", "LDLCALC" No results found for: "TSH"  Therapeutic Level Labs: No results found for: "LITHIUM" No results found for: "VALPROATE" No results found for: "CBMZ"   Screenings: GAD-7    Flowsheet [place] [facility] from [facility]/2022 [facility] Visit from [facility]20[facility] Visit from [facility]/05/2021 [facility]  Total GAD-7 Score 12 13 17  8      PHQ2-9    Flowsheet Row [facility] Visit from 05/13/2022 in [facility] at [facility] Office Visit from 04/15/2022 in [facility] at [facility] Office Visit from 02/05/2021 in [facility] at [facility] Office Visit from 12/25/2020 in [facility] at [facility]  PHQ-2 Total Score 2 4 6 2 2   PHQ-9 Total Score 9 20 23 5 10       Flowsheet Row Office Visit [facility] from 05/06/2021 in [facility] at [facility] ED to Hosp-Admission (Discharged) from 08/09/2020 in [place] [facility]  C-SSRS RISK CATEGORY Low Risk No Risk No Risk       Collaboration of Care: Collaboration of Care: Medication Management AEB medication prescription  Patient/Guardian was advised Release of Information must be obtained prior to any record release in order to collaborate their care with an outside  provider. Patient/Guardian was advised if they have not already done so to contact the registration department to sign all necessary forms in order for [place] to release information regarding their care.   Consent: Patient/Guardian gives verbal consent for treatment and assignment of benefits for services provided during this visit. Patient/Guardian expressed understanding and agreed to proceed.    Stasia Cavalier, MD 07/17/2022, 8:03 AM   Virtual Visit via Video Note  I connected with Enedina Finner on 07/17/22 at  8:00 AM EDT by a video enabled telemedicine application and verified that I am speaking with the correct person using two identifiers.  Location: Patient: In her car Provider: Home Office   I discussed the limitations of evaluation and management by telemedicine and the availability of in person appointments. The patient expressed understanding and agreed to proceed.   I discussed the assessment and treatment plan with the patient. The patient was provided an opportunity to ask questions and all were answered. The patient agreed with the plan and demonstrated an understanding of the instructions.   The patient was advised to call back or seek an in-person evaluation if the symptoms worsen or if the condition fails to improve as anticipated.  I provided 10 minutes of non-face-to-face time during this encounter.   Stasia Cavalier, MD

## 2022-08-01 DIAGNOSIS — M9902 Segmental and somatic dysfunction of thoracic region: Secondary | ICD-10-CM | POA: Diagnosis not present

## 2022-08-01 DIAGNOSIS — M9901 Segmental and somatic dysfunction of cervical region: Secondary | ICD-10-CM | POA: Diagnosis not present

## 2022-08-01 DIAGNOSIS — M9907 Segmental and somatic dysfunction of upper extremity: Secondary | ICD-10-CM | POA: Diagnosis not present

## 2022-08-01 DIAGNOSIS — M9905 Segmental and somatic dysfunction of pelvic region: Secondary | ICD-10-CM | POA: Diagnosis not present

## 2022-08-01 DIAGNOSIS — M9903 Segmental and somatic dysfunction of lumbar region: Secondary | ICD-10-CM | POA: Diagnosis not present

## 2022-08-13 DIAGNOSIS — M9901 Segmental and somatic dysfunction of cervical region: Secondary | ICD-10-CM | POA: Diagnosis not present

## 2022-08-13 DIAGNOSIS — M9905 Segmental and somatic dysfunction of pelvic region: Secondary | ICD-10-CM | POA: Diagnosis not present

## 2022-08-13 DIAGNOSIS — M9902 Segmental and somatic dysfunction of thoracic region: Secondary | ICD-10-CM | POA: Diagnosis not present

## 2022-08-13 DIAGNOSIS — M9903 Segmental and somatic dysfunction of lumbar region: Secondary | ICD-10-CM | POA: Diagnosis not present

## 2022-08-18 DIAGNOSIS — M9907 Segmental and somatic dysfunction of upper extremity: Secondary | ICD-10-CM | POA: Diagnosis not present

## 2022-08-18 DIAGNOSIS — M9902 Segmental and somatic dysfunction of thoracic region: Secondary | ICD-10-CM | POA: Diagnosis not present

## 2022-08-18 DIAGNOSIS — M9901 Segmental and somatic dysfunction of cervical region: Secondary | ICD-10-CM | POA: Diagnosis not present

## 2022-08-18 DIAGNOSIS — M9905 Segmental and somatic dysfunction of pelvic region: Secondary | ICD-10-CM | POA: Diagnosis not present

## 2022-08-18 DIAGNOSIS — M9903 Segmental and somatic dysfunction of lumbar region: Secondary | ICD-10-CM | POA: Diagnosis not present

## 2022-09-03 ENCOUNTER — Other Ambulatory Visit: Payer: Self-pay | Admitting: Obstetrics

## 2022-09-03 DIAGNOSIS — E282 Polycystic ovarian syndrome: Secondary | ICD-10-CM

## 2022-09-16 DIAGNOSIS — M9902 Segmental and somatic dysfunction of thoracic region: Secondary | ICD-10-CM | POA: Diagnosis not present

## 2022-09-16 DIAGNOSIS — M9903 Segmental and somatic dysfunction of lumbar region: Secondary | ICD-10-CM | POA: Diagnosis not present

## 2022-09-16 DIAGNOSIS — M9906 Segmental and somatic dysfunction of lower extremity: Secondary | ICD-10-CM | POA: Diagnosis not present

## 2022-09-16 DIAGNOSIS — M9905 Segmental and somatic dysfunction of pelvic region: Secondary | ICD-10-CM | POA: Diagnosis not present

## 2022-09-16 DIAGNOSIS — M9901 Segmental and somatic dysfunction of cervical region: Secondary | ICD-10-CM | POA: Diagnosis not present

## 2022-10-15 NOTE — Progress Notes (Unsigned)
BH MD/PA/NP OP Progress Note  10/15/2022 12:50 PM Eileen Todd  MRN:  [id number]  Visit Diagnosis:  No diagnosis found.   Assessment: HELI DINO is a 37 y.o. female with a history of anxiety, depression, PCOS, and endometriosis who presented to [facility] for initial evaluation on 06/16/2022.    At initial evaluation patient reported neurovegetative symptoms of depression including low mood, anhedonia, worthlessness, poor concentration, disturbed sleep, and decreased appetite.  She denied any SI or thoughts of self-harm.  Furthermore patient did endorse symptoms of anxiety including constant worry she is unable to control, restlessness, irritability, difficulty relaxing, and fears that something awful might happen.  Patient does have a history of past trauma though denies symptoms consistent with PTSD.  She also carries a diagnosis of PCOS and endometriosis.  Patient met criteria for MDD and GAD.   KEONDRIA SIEVER presents for follow-up evaluation. Today, 10/15/22, patient reports   that her anxiety and depression have both been improving over the past month. She has noticed increased energy/motivation after starting Wellbutrin specifically in the latter half of the day. Patient denies any adverse side effects from the medication and we will follow up in 3 months.   Plan: - Continue Lexapro 10 mg QD - Continue Wellbutrin Xl 150 mg QD - Continue Atarax 25 mg TID prn for anxiety - Dance therapy information provided - Crisis resources reviewed - Follow up in 3 months  Chief Complaint:  No chief complaint on file.  HPI: Eileen Todd presents reporting   that the last month things seemed to have been going pretty well. She feels like she has more energy towards the end of the day and has been able to complete tasks during that time. This has in turn improved the low mood and feelings of worthlessness. While she is not back at 100% she believes that she is close  and moving in the right direction. She denies any adverse side effects from the medication. As for her anxiety patient has only needed to take the Atarax once over the past month. Eileen Todd has not reached out for therapy yet as she was sick with RSV the past few weeks and is just getting her voice back. She would like to continue on her current regimen at this time.   Past Psychiatric History: Denies any prior psychiatric hospitalizations or suicide attempts.  Has been connected with therapy in the past though discontinued over time due to plateauing in her progress.  Has tried Celexa (SI) in the past. Currently on Lexapro, Wellbutrin, and Atarax  Denies any substance use including alcohol, marijuana, caffeine, or other substances. Had used caffeine in the past but it made her anxiety worse  Past Medical History:  Past Medical History:  Diagnosis Date   Endometriosis    Family history of adverse reaction to anesthesia    PCOS (polycystic ovarian syndrome)     Past Surgical History:  Procedure Laterality Date   CHOLECYSTECTOMY N/A 12/18/2019   Procedure: LAPAROSCOPIC CHOLECYSTECTOMY;  Surgeon: Kinsinger, De Blanch, MD;  Location: [facility] OR;  Service: General;  Laterality: N/A;   DILATION AND CURETTAGE OF UTERUS     WISDOM TOOTH EXTRACTION      Family History: No family history on file.  Social History:  Social History   Socioeconomic History   Marital status: Married    Spouse name: Not on file   Number of children: Not on file   Years of education: Not on file  Highest education level: Not on file  Occupational History   Not on file  Tobacco Use   Smoking status: Never    Passive exposure: Never   Smokeless tobacco: Never  Vaping Use   Vaping Use: Never used  Substance and Sexual Activity   Alcohol use: Never   Drug use: Never   Sexual activity: Yes    Partners: Male  Other Topics Concern   Not on file  Social History Narrative   Not on file   Social Determinants of  Health   Financial Resource Strain: Not on file  Food Insecurity: Not on file  Transportation Needs: Not on file  Physical Activity: Not on file  Stress: Not on file  Social Connections: Not on file    Allergies: No Known Allergies  Current Medications: Current Outpatient Medications  Medication Sig Dispense Refill   buPROPion (WELLBUTRIN XL) 150 MG 24 hr tablet Take 1 tablet (150 mg total) by mouth every morning. 90 tablet 1   escitalopram (LEXAPRO) 10 MG tablet Take 1 tablet (10 mg total) by mouth at bedtime. 90 tablet 1   hydrOXYzine (VISTARIL) 25 MG capsule Take 1 capsule (25 mg total) by mouth every 8 (eight) hours as needed. 30 capsule 2   ibuprofen (ADVIL) 800 MG tablet Take 1 tablet (800 mg total) by mouth every 8 (eight) hours as needed. 30 tablet 5   metFORMIN (GLUCOPHAGE-XR) 500 MG 24 hr tablet TAKE 3 TABLETS (1,500 MG TOTAL) BY MOUTH DAILY AFTER SUPPER. 270 tablet 3   Prenatal Vit-Fe Fumarate-FA (PRENATAL VITAMINS) 28-0.8 MG TABS TAKE 1 TABLET BY MOUTH EVERY DAY BEFORE BREAKFAST 90 tablet 3   vitamin B-12 (CYANOCOBALAMIN) 1000 MCG tablet Take 2,000 mcg by mouth daily.     No current facility-administered medications for this visit.     Psychiatric Specialty Exam: Review of Systems  There were no vitals taken for this visit.There is no height or weight on file to calculate BMI.  General Appearance: Well Groomed  Eye Contact:  Good  Speech:  Clear and Coherent  Volume:  Normal  Mood:  Euthymic  Affect:  Congruent  Thought Process:  Coherent and Goal Directed  Orientation:  Full (Time, Place, and Person)  Thought Content: Logical   Suicidal Thoughts:  No  Homicidal Thoughts:  No  Memory:  Immediate;   Good  Judgement:  Good  Insight:  Good  Psychomotor Activity:  Normal  Concentration:  Concentration: Good  Recall:  Good  Fund of Knowledge: Good  Language: Good  Akathisia:  NA    AIMS (if indicated): not done  Assets:  Communication Skills Desire for  Improvement Housing Talents/Skills Transportation Vocational/Educational  ADL's:  Intact  Cognition: WNL  Sleep:  Good   Metabolic Disorder Labs: No results found for: "HGBA1C", "MPG" No results found for: "PROLACTIN" No results found for: "CHOL", "TRIG", "HDL", "CHOLHDL", "VLDL", "LDLCALC" No results found for: "TSH"  Therapeutic Level Labs: No results found for: "LITHIUM" No results found for: "VALPROATE" No results found for: "CBMZ"   Screenings: GAD-7    Flowsheet Row [facility] Office Visit from 05/13/2022 in [facility] Office Visit from 04/15/2022 in [facility] Office Visit from 02/05/2021 in [facility] at [facility]  Total GAD-7 Score 12 13 17 8       PHQ2-9    Flowsheet Row [facility] Office Visit from 05/13/2022 in [facility] at [facility] Office Visit from 04[facility]15/2022 in [facility] at [facility] Office Visit from 02/05/2021 in [facility] at [facility] Office Visit from 12/25/2020 in [facility] at [facility]  PHQ-2 Total Score 2 4 6 2 2   PHQ-9 Total Score 9 20 23 5 10       Flowsheet Row Office Visit from 06/16/2022 in [facility] ED from 05/06/2021 in [facility]) ED to Hosp-Admission (Discharged) from 08/09/2020 in [facility] 6 EAST ONCOLOGY  C-SSRS RISK CATEGORY Low Risk No Risk No Risk       Collaboration of Care: Collaboration of Care: Medication Management AEB medication prescription  Patient/Guardian was advised Release of Information must be obtained prior to any record release in order to collaborate their care with an outside provider. Patient/Guardian was advised if they have not already done so  to contact the registration department to sign all necessary forms in order for [place] to release information regarding their care.   Consent: Patient/Guardian gives verbal consent for treatment and assignment of benefits for services provided during this visit. Patient/Guardian expressed understanding and agreed to proceed.    Stasia Cavalier, MD 10/15/2022, 12:50 PM   Virtual Visit via Video Note  I connected with Enedina Finner on 10/15/22 at  8:30 AM EDT by a video enabled telemedicine application and verified that I am speaking with the correct person using two identifiers.  Location: Patient: In her car Provider: Home Office   I discussed the limitations of evaluation and management by telemedicine and the availability of in person appointments. The patient expressed understanding and agreed to proceed.   I discussed the assessment and treatment plan with the patient. The patient was provided an opportunity to ask questions and all were answered. The patient agreed with the plan and demonstrated an understanding of the instructions.   The patient was advised to call back or seek an in-person evaluation if the symptoms worsen or if the condition fails to improve as anticipated.  I provided 10 minutes of non-face-to-face time during this encounter.   Stasia Cavalier, MD

## 2022-10-16 ENCOUNTER — Encounter (HOSPITAL_COMMUNITY): Payer: BC Managed Care – PPO | Admitting: Psychiatry

## 2022-10-16 ENCOUNTER — Encounter (HOSPITAL_COMMUNITY): Payer: Self-pay

## 2022-10-16 NOTE — Progress Notes (Signed)
This encounter was created in error - please disregard.

## 2022-10-21 DIAGNOSIS — M9902 Segmental and somatic dysfunction of thoracic region: Secondary | ICD-10-CM | POA: Diagnosis not present

## 2022-10-21 DIAGNOSIS — M9905 Segmental and somatic dysfunction of pelvic region: Secondary | ICD-10-CM | POA: Diagnosis not present

## 2022-10-21 DIAGNOSIS — M9901 Segmental and somatic dysfunction of cervical region: Secondary | ICD-10-CM | POA: Diagnosis not present

## 2022-10-21 DIAGNOSIS — M9906 Segmental and somatic dysfunction of lower extremity: Secondary | ICD-10-CM | POA: Diagnosis not present

## 2022-10-21 DIAGNOSIS — M9903 Segmental and somatic dysfunction of lumbar region: Secondary | ICD-10-CM | POA: Diagnosis not present

## 2022-12-11 NOTE — Telephone Encounter (Signed)
See routing comment(s) for message information.

## 2023-04-19 ENCOUNTER — Ambulatory Visit
Admission: EM | Admit: 2023-04-19 | Discharge: 2023-04-19 | Disposition: A | Discharge status: Home / Self Care | Payer: BC Managed Care – PPO | Attending: Physician Assistant | Admitting: Physician Assistant

## 2023-04-19 ENCOUNTER — Encounter: Payer: Self-pay | Admitting: *Deleted

## 2023-04-19 ENCOUNTER — Other Ambulatory Visit: Payer: Self-pay

## 2023-04-19 DIAGNOSIS — H109 Unspecified conjunctivitis: Secondary | ICD-10-CM | POA: Diagnosis not present

## 2023-04-19 MED ORDER — POLYMYXIN B-TRIMETHOPRIM 10000-0.1 UNIT/ML-% OP SOLN: 2.0000 [drp] | OPHTHALMIC | 0 refills | Status: AC

## 2023-04-19 NOTE — ED Triage Notes (Addendum)
 Pr reports she thought she got a dog hair in her left eye today- she went to flush it out and noticed her left eye was swollen. Airway intact, no acute distress. C/o light sensitivity, left eye sclera swollen. She took claritin pta

## 2023-04-19 NOTE — ED Provider Notes (Signed)
 [facility]    CSN: [id number] Arrival date & time: 04/19/23  1226      History   Chief Complaint Chief Complaint  Patient presents with   Facial Swelling    HPI Eileen Todd is a 38 y.o. female.   Resents with left eye redness and swelling that started today upon waking.  Reports the eye is very itchy.  She denies drainage or discharge.  She reports blurry vision but no pain.  She has tried nothing for the symptoms.  Initially thought she might of gotten a dog care in the eye but there is nothing there.  Denies injury or trauma.  Patient does not wear contacts or glasses.    Past Medical History:  Diagnosis Date   Endometriosis    Family history of adverse reaction to anesthesia    PCOS (polycystic ovarian syndrome)     Patient Active Problem List   Diagnosis Date Noted   Anxiety and depression 04/15/2022   Endometriosis determined by laparoscopy 03/26/2021   Other iron deficiency anemias 12/25/2020   Symptomatic anemia 08/09/2020   Vaginal bleeding 08/09/2020   PCOS (polycystic ovarian syndrome) 08/09/2020   Acute cholecystitis due to biliary calculus 12/17/2019    Past Surgical History:  Procedure Laterality Date   CHOLECYSTECTOMY N/A 12/18/2019   Procedure: LAPAROSCOPIC CHOLECYSTECTOMY;  Surgeon: Kinsinger, Herlene Righter, MD;  Location: [facility] OR;  Service: General;  Laterality: N/A;   DILATION AND CURETTAGE OF UTERUS     WISDOM TOOTH EXTRACTION      OB History     Gravida  0   Para  0   Term  0   Preterm  0   AB  0   Living  0      SAB  0   IAB  0   Ectopic  0   Multiple  0   Live Births  0            Home Medications    Prior to Admission medications   Medication Sig Start Date End Date Taking? Authorizing Provider  buPROPion  (WELLBUTRIN  XL) 150 MG 24 hr tablet Take 1 tablet (150 mg total) by mouth every morning. 07/17/22 07/17/23 Yes Carvin Arvella CHRISTELLA, MD  escitalopram  (LEXAPRO ) 10 MG tablet Take 1 tablet (10 mg total) by  mouth at bedtime. 07/17/22 04/19/23 Yes Carvin Arvella CHRISTELLA, MD  hydrOXYzine  (VISTARIL ) 25 MG capsule Take 1 capsule (25 mg total) by mouth every 8 (eight) hours as needed. 05/13/22  Yes Lorren, Amy J, NP  ibuprofen  (ADVIL ) 800 MG tablet Take 1 tablet (800 mg total) by mouth every 8 (eight) hours as needed. 11/19/20  Yes Rudy Carlin LABOR, MD  Prenatal Vit-Fe Fumarate-FA (PRENATAL VITAMINS) 28-0.8 MG TABS TAKE 1 TABLET BY MOUTH EVERY DAY BEFORE BREAKFAST 08/27/21  Yes Rudy Carlin LABOR, MD  trimethoprim -polymyxin b  (POLYTRIM ) ophthalmic solution Place 2 drops into the left eye every 4 (four) hours for 5 days. 04/19/23 04/24/23 Yes Ward, Harlene PEDLAR, PA-C  vitamin B-12 (CYANOCOBALAMIN) 1000 MCG tablet Take 2,000 mcg by mouth daily.   Yes [provider]  metFORMIN  (GLUCOPHAGE -XR) 500 MG 24 hr tablet TAKE 3 TABLETS (1,500 MG TOTAL) BY MOUTH DAILY AFTER SUPPER. Patient not taking: Reported on 04/19/2023 09/03/22   Rudy Carlin LABOR, MD    Family History History reviewed. No pertinent family history.  Social History Social History   Tobacco Use   Smoking status: Never    Passive exposure: Never   Smokeless tobacco:  Never  Vaping Use   Vaping status: Never Used  Substance Use Topics   Alcohol use: Never   Drug use: Never     Allergies   Patient has no known allergies.   Review of Systems Review of Systems  Constitutional:  Negative for chills and fever.  HENT:  Negative for ear pain and sore throat.   Eyes:  Positive for redness. Negative for pain and visual disturbance.  Respiratory:  Negative for cough and shortness of breath.   Cardiovascular:  Negative for chest pain and palpitations.  Gastrointestinal:  Negative for abdominal pain and vomiting.  Genitourinary:  Negative for dysuria and hematuria.  Musculoskeletal:  Negative for arthralgias and back pain.  Skin:  Negative for color change and rash.  Neurological:  Negative for seizures and syncope.  All other systems reviewed  and are negative.    Physical Exam Triage Vital Signs ED Triage Vitals  Encounter Vitals Group     BP 04/19/23 1240 133/85     Systolic BP Percentile --      Diastolic BP Percentile --      Pulse Rate 04/19/23 1240 87     Resp 04/19/23 1240 18     Temp 04/19/23 1240 98.5 F (36.9 C)     Temp Source 04/19/23 1240 Oral     SpO2 04/19/23 1240 97 %     Weight --      Height --      Head Circumference --      Peak Flow --      Pain Score 04/19/23 1237 3     Pain Loc --      Pain Education --      Exclude from Growth Chart --    No data found.  Updated Vital Signs BP 133/85 (BP Location: Right Arm)   Pulse 87   Temp 98.5 F (36.9 C) (Oral)   Resp 18   LMP 04/18/2023   SpO2 97%   Visual Acuity Right Eye Distance: 20/20 Left Eye Distance: 20/50 Bilateral Distance: 20/52 (UNCORRECTED)  Right Eye Near:   Left Eye Near:    Bilateral Near:     Physical Exam Vitals and nursing note reviewed.  Constitutional:      General: She is not in acute distress.    Appearance: She is well-developed.  HENT:     Head: Normocephalic and atraumatic.  Eyes:     Conjunctiva/sclera:     Left eye: Left conjunctiva is injected.     Comments: Scleral swelling noted.  Cardiovascular:     Rate and Rhythm: Normal rate and regular rhythm.     Heart sounds: No murmur heard. Pulmonary:     Effort: Pulmonary effort is normal. No respiratory distress.     Breath sounds: Normal breath sounds.  Abdominal:     Palpations: Abdomen is soft.     Tenderness: There is no abdominal tenderness.  Musculoskeletal:        General: No swelling.     Cervical back: Neck supple.  Skin:    General: Skin is warm and dry.     Capillary Refill: Capillary refill takes less than 2 seconds.  Neurological:     Mental Status: She is alert.  Psychiatric:        Mood and Affect: Mood normal.      UC Treatments / Results  Labs (all labs ordered are listed, but only abnormal results are displayed) Labs  Reviewed - No data to display  EKG   Radiology No results found.  Procedures Procedures (including critical care time)  Medications Ordered in UC Medications - No data to display  Initial Impression / Assessment and Plan / UC Course  I have reviewed the triage vital signs and the nursing notes.  Pertinent labs & imaging results that were available during my care of the patient were reviewed by me and considered in my medical decision making (see chart for details).     Will treat as bacterial conjunctivitis with eyedrops.  Advise if no improvement follow-up with ophthalmology. Final Clinical Impressions(s) / UC Diagnoses   Final diagnoses:  Bacterial conjunctivitis     Discharge Instructions      Use eye drops as prescribed If no improvement follow up with ophthalmologist  Can apply cold compress for comfort  Avoid eye makeup, creams or lotions around the eye   ED Prescriptions     Medication Sig Dispense Auth. Provider   trimethoprim -polymyxin b  (POLYTRIM ) ophthalmic solution Place 2 drops into the left eye every 4 (four) hours for 5 days. 10 mL Ward, Harlene PEDLAR, PA-C      PDMP not reviewed this encounter.   Ward, Harlene PEDLAR, PA-C 04/19/23 1322

## 2023-04-19 NOTE — Discharge Instructions (Signed)
 Use eye drops as prescribed If no improvement follow up with ophthalmologist  Can apply cold compress for comfort  Avoid eye makeup, creams or lotions around the eye

## 2023-04-21 ENCOUNTER — Emergency Department (HOSPITAL_COMMUNITY)
Admission: EM | Admit: 2023-04-21 | Discharge: 2023-04-22 | Disposition: A | Discharge status: Home / Self Care | Payer: Managed Care, Other (non HMO) | Attending: Emergency Medicine | Admitting: Emergency Medicine

## 2023-04-21 ENCOUNTER — Encounter (HOSPITAL_COMMUNITY): Payer: Self-pay | Admitting: Pharmacy Technician

## 2023-04-21 ENCOUNTER — Other Ambulatory Visit: Payer: Self-pay

## 2023-04-21 DIAGNOSIS — N809 Endometriosis, unspecified: Secondary | ICD-10-CM | POA: Diagnosis not present

## 2023-04-21 DIAGNOSIS — R102 Pelvic and perineal pain: Secondary | ICD-10-CM | POA: Diagnosis present

## 2023-04-21 LAB — PREGNANCY, URINE: Preg Test, Ur: NEGATIVE

## 2023-04-21 LAB — POC URINE PREG, ED: Preg Test, Ur: NEGATIVE

## 2023-04-21 NOTE — ED Notes (Signed)
 Delay in bring to triage due to triage full of BH pts.

## 2023-04-21 NOTE — ED Triage Notes (Signed)
 Pt here via POV with reports of lower back/pelvic pain R>L. Pain started around 0330 this morning. Took 800mg  ibuprofen approx 0430 today without relief. Hx endometriosis.

## 2023-04-22 ENCOUNTER — Emergency Department (HOSPITAL_COMMUNITY): Payer: Managed Care, Other (non HMO)

## 2023-04-22 DIAGNOSIS — N809 Endometriosis, unspecified: Secondary | ICD-10-CM | POA: Diagnosis not present

## 2023-04-22 LAB — CBC WITH DIFFERENTIAL/PLATELET
Abs Immature Granulocytes: 0.02 10*3/uL (ref 0.00–0.07)
Basophils Absolute: 0 10*3/uL (ref 0.0–0.1)
Basophils Relative: 0 %
Eosinophils Absolute: 0.1 10*3/uL (ref 0.0–0.5)
Eosinophils Relative: 1 %
HCT: 28.3 % — ABNORMAL LOW (ref 36.0–46.0)
Hemoglobin: 8 g/dL — ABNORMAL LOW (ref 12.0–15.0)
Immature Granulocytes: 0 %
Lymphocytes Relative: 15 %
Lymphs Abs: 1.3 10*3/uL (ref 0.7–4.0)
MCH: 20.8 pg — ABNORMAL LOW (ref 26.0–34.0)
MCHC: 28.3 g/dL — ABNORMAL LOW (ref 30.0–36.0)
MCV: 73.7 fL — ABNORMAL LOW (ref 80.0–100.0)
Monocytes Absolute: 0.6 10*3/uL (ref 0.1–1.0)
Monocytes Relative: 8 %
Neutro Abs: 6.4 10*3/uL (ref 1.7–7.7)
Neutrophils Relative %: 76 %
Platelets: 326 10*3/uL (ref 150–400)
RBC: 3.84 MIL/uL — ABNORMAL LOW (ref 3.87–5.11)
RDW: 16.9 % — ABNORMAL HIGH (ref 11.5–15.5)
WBC: 8.4 10*3/uL (ref 4.0–10.5)
nRBC: 0 % (ref 0.0–0.2)

## 2023-04-22 LAB — COMPREHENSIVE METABOLIC PANEL
ALT: 31 U/L (ref 0–44)
AST: 23 U/L (ref 15–41)
Albumin: 3.9 g/dL (ref 3.5–5.0)
Alkaline Phosphatase: 79 U/L (ref 38–126)
Anion gap: 11 (ref 5–15)
BUN: 7 mg/dL (ref 6–20)
CO2: 21 mmol/L — ABNORMAL LOW (ref 22–32)
Calcium: 8.9 mg/dL (ref 8.9–10.3)
Chloride: 105 mmol/L (ref 98–111)
Creatinine, Ser: 0.6 mg/dL (ref 0.44–1.00)
GFR, Estimated: >60 mL/min (ref >60)
Glucose, Bld: 94 mg/dL (ref 70–99)
Potassium: 3.9 mmol/L (ref 3.5–5.1)
Sodium: 137 mmol/L (ref 135–145)
Total Bilirubin: 0.5 mg/dL (ref 0.0–1.2)
Total Protein: 7.1 g/dL (ref 6.5–8.1)

## 2023-04-22 LAB — LIPASE, BLOOD: Lipase: 30 U/L (ref 11–51)

## 2023-04-22 MED ORDER — KETOROLAC TROMETHAMINE 15 MG/ML IJ SOLN
15.0000 mg | Freq: Once | INTRAMUSCULAR | Status: AC
  Administered 2023-04-22: 15 mg via INTRAVENOUS
  Filled 2023-04-22: qty 1

## 2023-04-22 MED ORDER — MORPHINE SULFATE (PF) 2 MG/ML IV SOLN
2.0000 mg | Freq: Once | INTRAVENOUS | Status: AC
Start: 2023-04-22 — End: 2023-04-22
  Administered 2023-04-22: 2 mg via INTRAVENOUS
  Filled 2023-04-22: qty 1

## 2023-04-22 MED ORDER — ONDANSETRON HCL 4 MG/2ML IJ SOLN
4.0000 mg | Freq: Once | INTRAMUSCULAR | Status: AC
  Administered 2023-04-22: 4 mg via INTRAVENOUS
  Filled 2023-04-22: qty 2

## 2023-04-22 MED ORDER — MORPHINE SULFATE (PF) 2 MG/ML IV SOLN
2.0000 mg | Freq: Once | INTRAVENOUS | Status: AC
  Administered 2023-04-22: 2 mg via INTRAVENOUS
  Filled 2023-04-22: qty 1

## 2023-04-22 MED ORDER — HYDROMORPHONE HCL 1 MG/ML IJ SOLN
1.0000 mg | Freq: Once | INTRAMUSCULAR | Status: AC
Start: 2023-04-22 — End: 2023-04-22
  Administered 2023-04-22: 1 mg via INTRAVENOUS
  Filled 2023-04-22: qty 1

## 2023-04-22 MED ORDER — OXYCODONE-ACETAMINOPHEN 5-325 MG PO TABS: 1.0000 | ORAL_TABLET | Freq: Four times a day (QID) | ORAL | 0 refills | Status: AC | PRN

## 2023-04-22 MED ORDER — ONDANSETRON HCL 4 MG PO TABS: 4.0000 mg | ORAL_TABLET | Freq: Four times a day (QID) | ORAL | 0 refills | Status: AC

## 2023-04-22 NOTE — Discharge Instructions (Addendum)
 Today you were seen for abdominal pain.  Please pick up your medication and take as needed for severe pain.  Please follow-up with OB/GYN for further evaluation and treatment.  Thank you for letting us  treat you today. After reviewing your labs and imaging, I feel you are safe to go home. Please follow up with your PCP in the next several days and provide them with your records from this visit. Return to the Emergency Room if pain becomes severe or symptoms worsen.

## 2023-04-22 NOTE — ED Notes (Signed)
 Patient Alert and oriented to baseline. Stable and ambulatory to baseline. Patient verbalized understanding of the discharge instructions.  Patient belongings were taken by the patient.

## 2023-04-22 NOTE — ED Provider Notes (Signed)
[place] [facility] AT [facility] Provider Note   CSN: [id number] Arrival date & time: 04/21/23  6962     History  Chief Complaint  Patient presents with   Back Pain    Eileen Todd is a 38 y.o. female past medical history stage 3 endometriosis and PCOS presents with lower back, lower abdominal pain and pelvic pain. She believes this is a flare of her endometriosis. She is here today because she is not able to manage the pain at home. The pain started at 3 AM on Saturday night. It has been intermittent since then. She is not currently taking anything for pain. She is not currently on birth control. She had laparoscopic surgery about 1 year ago for removal of intra-abdominal endometrial tissue. She is currently on her menstrual cycle and is having heavy bleeding. She denies fever, chills, shortness of breath, chest pain, dysuria, blood in the stool. She is experiencing some headaches, nausea and an episode of vomiting yesterday morning. She does mention some tingling down the front of her thighs when her pelvic pain is at its worst.  Patient states that her last bowel movement was Monday night and was of normal caliber and consistency.   Back Pain Associated symptoms: pelvic pain        Home Medications Prior to Admission medications   Medication Sig Start Date End Date Taking? Authorizing Provider  buPROPion (WELLBUTRIN XL) 150 MG 24 hr tablet Take 1 tablet (150 mg total) by mouth every morning. 07/17/22 07/17/23 Yes Stasia Cavalier, MD  escitalopram (LEXAPRO) 10 MG tablet Take 1 tablet (10 mg total) by mouth at bedtime. 07/17/22 04/22/23 Yes Stasia Cavalier, MD  hydrOXYzine (VISTARIL) 25 MG capsule Take 1 capsule (25 mg total) by mouth every 8 (eight) hours as needed. 05/13/22  Yes Zonia Kief, Amy J, NP  ibuprofen (ADVIL) 800 MG tablet Take 1 tablet (800 mg total) by mouth every 8 (eight) hours as needed. 11/19/20  Yes Brock Bad, MD  ondansetron (ZOFRAN) 4 MG  tablet Take 1 tablet (4 mg total) by mouth every 6 (six) hours. 04/22/23  Yes Dolphus Jenny, PA-C  oxyCODONE-acetaminophen (PERCOCET/ROXICET) 5-325 MG tablet Take 1 tablet by mouth every 6 (six) hours as needed for up to 5 days for severe pain (pain score 7-10). 04/22/23 04/27/23 Yes Dolphus Jenny, PA-C  Prenatal Vit-Fe Fumarate-FA (PRENATAL VITAMINS) 28-0.8 MG TABS TAKE 1 TABLET BY MOUTH EVERY DAY BEFORE BREAKFAST 08/27/21  Yes Brock Bad, MD  trimethoprim-polymyxin b (POLYTRIM) ophthalmic solution Place 2 drops into the left eye every 4 (four) hours for 5 days. 04/19/23 04/24/23 Yes Ward, Tylene Fantasia, PA-C  vitamin B-12 (CYANOCOBALAMIN) 1000 MCG tablet Take 2,000 mcg by mouth daily.   Yes [provider]      Allergies    Patient has no known allergies.    Review of Systems   Review of Systems  Constitutional:  Positive for chills.  Gastrointestinal:  Positive for vomiting.  Genitourinary:  Positive for pelvic pain and vaginal bleeding.    Physical Exam Updated Vital Signs BP 116/63 (BP Location: Left Arm)   Pulse 87   Temp 98.1 F (36.7 C) (Oral)   Resp 18   LMP 04/18/2023   SpO2 100%  Physical Exam Vitals and nursing note reviewed.  Constitutional:      General: She is not in acute distress.    Appearance: She is well-developed. She is obese. She is diaphoretic.  HENT:  Head: Normocephalic and atraumatic.     Right Ear: External ear normal.     Left Ear: External ear normal.     Nose: Nose normal.  Eyes:     Extraocular Movements: Extraocular movements intact.     Conjunctiva/sclera: Conjunctivae normal.  Cardiovascular:     Rate and Rhythm: Normal rate and regular rhythm.     Pulses: Normal pulses.     Heart sounds: Normal heart sounds. No murmur heard. Pulmonary:     Effort: Pulmonary effort is normal. No respiratory distress.     Breath sounds: Normal breath sounds.  Abdominal:     General: Abdomen is protuberant. Bowel sounds are normal.      Palpations: Abdomen is soft.     Tenderness: There is abdominal tenderness in the right lower quadrant, suprapubic area and left lower quadrant. There is no right CVA tenderness or left CVA tenderness.  Musculoskeletal:        General: No swelling.     Cervical back: Neck supple.     Right lower leg: No edema.     Left lower leg: No edema.  Skin:    General: Skin is warm.     Capillary Refill: Capillary refill takes less than 2 seconds.  Neurological:     General: No focal deficit present.     Mental Status: She is alert.     Motor: No weakness.  Psychiatric:        Mood and Affect: Mood normal.     ED Results / Procedures / Treatments   Labs (all labs ordered are listed, but only abnormal results are displayed) Labs Reviewed  COMPREHENSIVE METABOLIC PANEL - Abnormal; Notable for the following components:      Result Value   CO2 21 (*)    All other components within normal limits  CBC WITH DIFFERENTIAL/PLATELET - Abnormal; Notable for the following components:   RBC 3.84 (*)    Hemoglobin 8.0 (*)    HCT 28.3 (*)    MCV 73.7 (*)    MCH 20.8 (*)    MCHC 28.3 (*)    RDW 16.9 (*)    All other components within normal limits  PREGNANCY, URINE  LIPASE, BLOOD  URINALYSIS, W/ REFLEX TO CULTURE (INFECTION SUSPECTED)  POC URINE PREG, ED    EKG None  Radiology US PELVIC COMPLETE W TRANSVAGINAL AND TORSION R/O Result Date: 04/22/2023 CLINICAL DATA:  Pelvic pain EXAM: TRANSABDOMINAL AND TRANSVAGINAL ULTRASOUND OF PELVIS DOPPLER ULTRASOUND OF OVARIES TECHNIQUE: Both transabdominal and transvaginal ultrasound examinations of the pelvis were performed. Transabdominal technique was performed for global imaging of the pelvis including uterus, ovaries, adnexal regions, and pelvic cul-de-sac. It was necessary to proceed with endovaginal exam following the transabdominal exam to visualize the uterus, endometrium and ovaries. Color and duplex Doppler ultrasound was utilized to evaluate blood  flow to the ovaries. COMPARISON:  11/15/2020 FINDINGS: Uterus Measurements: 10.2 x 7.7 x 8.5 cm = volume: 350.7 mL. Within the right-side of the uterine fundus there is a partially degenerated fibroid with internal cystic components measuring 3.2 x 4.4 by 2.5 cm. Endometrium Thickness: 10.9 mm.  No focal abnormality visualized. Right ovary Measurements: 2.6 by 3.2 x 4.1 cm = volume: 17.5 mL. Multiple prominent peripheral follicles are identified. No adnexal mass. Left ovary Measurements: 4.2 x 1.6 x 1.9 cm = volume: 6.7 mL. Normal appearance/no adnexal mass. Pulsed Doppler evaluation of both ovaries demonstrates normal low-resistance arterial and venous waveforms. Other findings No abnormal free fluid. IMPRESSION:  1. No acute findings.  No findings of ovarian torsion. 2. Partially degenerated fibroid within the right-side of the uterine fundus measuring 3.2 x 4.4 x 2.5 cm. Electronically Signed   By: Signa Kell M.D.   On: 04/22/2023 12:30    Procedures Procedures    Medications Ordered in ED Medications  morphine (PF) 2 MG/ML injection 2 mg (2 mg Intravenous Given 04/22/23 1042)  ondansetron (ZOFRAN) injection 4 mg (4 mg Intravenous Given 04/22/23 1041)  morphine (PF) 2 MG/ML injection 2 mg (2 mg Intravenous Given 04/22/23 1205)  HYDROmorphone (DILAUDID) injection 1 mg (1 mg Intravenous Given 04/22/23 1243)  ketorolac (TORADOL) 15 MG/ML injection 15 mg (15 mg Intravenous Given 04/22/23 1316)    ED Course/ Medical Decision Making/ A&P                                 Medical Decision Making Amount and/or Complexity of Data Reviewed Labs: ordered. Radiology: ordered.   This patient presents to the ED with chief complaint(s) of abdominal/pelvic pain with pertinent past medical history of endometriosis, PCOS which further complicates the presenting complaint. The complaint involves an extensive differential diagnosis and also carries with it a high risk of complications and morbidity.    The  differential diagnosis includes endometriosis flare, PCOS, appendicitis, ovarian torsion, choledocholithiasis, SBO  Additional history obtained: Records reviewed Care Everywhere/External Records  ED Course and Reassessment: Urine Preg: Negative CMP: Mildly decreased CO2 Lipase: 30 CBC: Anemia at 8 which is chronic per historical values  Independent labs interpretation:  The following labs were independently interpreted:    Independent visualization of imaging: - I independently visualized the following imaging with scope of interpretation limited to determining acute life threatening conditions related to emergency care:  Ultrasound torsion rule out:  Consultation: - Consulted or discussed management/test interpretation w/ external professional: None  Consideration for admission or further workup: Consider for mission further evaluation vital signs, physical exam, imaging, and labs have all been reassuring.  Patient symptoms likely due to endometriosis flare.  Patient given short course of Percocet outpatient for severe pain and advised to use Tylenol/Motrin for mild to moderate pain.  Patient should follow-up with OB/GYN for further evaluation and treatment.        Final Clinical Impression(s) / ED Diagnoses Final diagnoses:  Endometriosis    Rx / DC Orders ED Discharge Orders          Ordered    oxyCODONE-acetaminophen (PERCOCET/ROXICET) 5-325 MG tablet  Every 6 hours PRN        04/22/23 1259    ondansetron (ZOFRAN) 4 MG tablet  Every 6 hours        04/22/23 1300              Dolphus Jenny, PA-C 04/22/23 1320    Laurence Spates, MD 04/23/23 [phone]

## 2023-10-05 ENCOUNTER — Encounter: Payer: Self-pay | Admitting: Emergency Medicine

## 2023-10-05 ENCOUNTER — Ambulatory Visit
Admission: EM | Admit: 2023-10-05 | Discharge: 2023-10-05 | Disposition: A | Discharge status: Home / Self Care | Attending: Physician Assistant | Admitting: Physician Assistant

## 2023-10-05 DIAGNOSIS — N809 Endometriosis, unspecified: Secondary | ICD-10-CM | POA: Diagnosis not present

## 2023-10-05 DIAGNOSIS — R102 Pelvic and perineal pain: Secondary | ICD-10-CM

## 2023-10-05 MED ORDER — KETOROLAC TROMETHAMINE 15 MG/ML IJ SOLN
30.0000 mg | Freq: Once | INTRAMUSCULAR | Status: AC
Start: 2023-10-05 — End: 2023-10-05
  Administered 2023-10-05: 30 mg via INTRAMUSCULAR

## 2023-10-05 MED ORDER — KETOROLAC TROMETHAMINE 30 MG/ML IJ SOLN: 30.0000 mg | Freq: Once | INTRAMUSCULAR | Status: DC

## 2023-10-05 NOTE — ED Triage Notes (Signed)
 Pt reports pelvic pain due to grade 3 endometriosis flare x1 week. Pain has continued to increase to unmanageable level over the last 18hrs. Pt is waiting to get in with a endometriosis specialists. Describes constant pelvic pain at baseline that has shifted to radiating and searing pain down into thighs. Pt can usually manage flares with ibuprofen  and tylenol , but it has not touched pain this time.

## 2023-10-05 NOTE — ED Provider Notes (Signed)
 [facility]    CSN: [id number] Arrival date & time: 10/05/23  1442      History   Chief Complaint Chief Complaint  Patient presents with   Pelvic Pain    HPI Eileen Todd is a 38 y.o. female.   Patient here today for evaluation of pelvic pain that is been ongoing for the last week.  She reports that pain has been unmanageable for the last 18 hours.  She states that this is typical pain for her endometriosis but is not typically this bad.  She notes that at times her pain will radiate into her thigh area.  She typically can manage pain with ibuprofen  and Tylenol  but this has not been helpful.  Her last dose of ibuprofen  was approximately 7 hours ago. She denies risk of pregnancy.   The history is provided by the patient.  Pelvic Pain Pertinent negatives include no abdominal pain and no shortness of breath.    Past Medical History:  Diagnosis Date   Endometriosis    Family history of adverse reaction to anesthesia    PCOS (polycystic ovarian syndrome)     Patient Active Problem List   Diagnosis Date Noted   Anxiety and depression 04/15/2022   Endometriosis determined by laparoscopy 03/26/2021   Other iron deficiency anemias 12/25/2020   Symptomatic anemia 08/09/2020   Vaginal bleeding 08/09/2020   PCOS (polycystic ovarian syndrome) 08/09/2020   Acute cholecystitis due to biliary calculus 12/17/2019    Past Surgical History:  Procedure Laterality Date   CHOLECYSTECTOMY N/A 12/18/2019   Procedure: LAPAROSCOPIC CHOLECYSTECTOMY;  Surgeon: Kinsinger, Herlene Righter, MD;  Location: [facility] OR;  Service: General;  Laterality: N/A;   DILATION AND CURETTAGE OF UTERUS     WISDOM TOOTH EXTRACTION      OB History     Gravida  0   Para  0   Term  0   Preterm  0   AB  0   Living  0      SAB  0   IAB  0   Ectopic  0   Multiple  0   Live Births  0            Home Medications    Prior to Admission medications   Medication Sig Start Date End Date  Taking? Authorizing Provider  buPROPion  (WELLBUTRIN  XL) 150 MG 24 hr tablet Take 1 tablet (150 mg total) by mouth every morning. 07/17/22 10/05/23 Yes Carvin Arvella CHRISTELLA, MD  escitalopram  (LEXAPRO ) 10 MG tablet Take 1 tablet (10 mg total) by mouth at bedtime. 07/17/22 10/05/23 Yes Carvin Arvella CHRISTELLA, MD  ibuprofen  (ADVIL ) 800 MG tablet Take 1 tablet (800 mg total) by mouth every 8 (eight) hours as needed. 11/19/20  Yes Rudy Carlin LABOR, MD  ondansetron  (ZOFRAN ) 4 MG tablet Take 1 tablet (4 mg total) by mouth every 6 (six) hours. 04/22/23  Yes Francis Ileana SAILOR, PA-C  Prenatal Vit-Fe Fumarate-FA (PRENATAL VITAMINS) 28-0.8 MG TABS TAKE 1 TABLET BY MOUTH EVERY DAY BEFORE BREAKFAST 08/27/21  Yes Rudy Carlin LABOR, MD  vitamin B-12 (CYANOCOBALAMIN) 1000 MCG tablet Take 2,000 mcg by mouth daily.   Yes [provider]  hydrOXYzine  (VISTARIL ) 25 MG capsule Take 1 capsule (25 mg total) by mouth every 8 (eight) hours as needed. 05/13/22   Lorren Greig PARAS, NP    Family History History reviewed. No pertinent family history.  Social History Social History   Tobacco Use   Smoking status: Never  Passive exposure: Never   Smokeless tobacco: Never  Vaping Use   Vaping status: Never Used  Substance Use Topics   Alcohol use: Never   Drug use: Never     Allergies   Patient has no known allergies.   Review of Systems Review of Systems  Constitutional:  Negative for chills and fever.  Eyes:  Negative for discharge and redness.  Respiratory:  Negative for shortness of breath.   Gastrointestinal:  Negative for abdominal pain, diarrhea, nausea and vomiting.  Genitourinary:  Positive for pelvic pain. Negative for vaginal bleeding and vaginal discharge.     Physical Exam Triage Vital Signs ED Triage Vitals [10/05/23 1506]  Encounter Vitals Group     BP (!) 140/87     Girls Systolic BP Percentile      Girls Diastolic BP Percentile      Boys Systolic BP Percentile      Boys Diastolic BP Percentile       Pulse Rate (!) 101     Resp 16     Temp 98.2 F (36.8 C)     Temp Source Oral     SpO2 95 %     Weight      Height      Head Circumference      Peak Flow      Pain Score 9     Pain Loc      Pain Education      Exclude from Growth Chart    No data found.  Updated Vital Signs BP (!) 140/87 (BP Location: Left Arm)   Pulse (!) 101   Temp 98.2 F (36.8 C) (Oral)   Resp 16   LMP 09/22/2023 (Exact Date)   SpO2 95%   Visual Acuity Right Eye Distance:   Left Eye Distance:   Bilateral Distance:    Right Eye Near:   Left Eye Near:    Bilateral Near:     Physical Exam Vitals and nursing note reviewed.  Constitutional:      General: She is not in acute distress.    Appearance: Normal appearance. She is not ill-appearing.  HENT:     Head: Normocephalic and atraumatic.   Eyes:     Conjunctiva/sclera: Conjunctivae normal.    Cardiovascular:     Rate and Rhythm: Normal rate and regular rhythm.  Pulmonary:     Effort: Pulmonary effort is normal. No respiratory distress.     Breath sounds: No wheezing, rhonchi or rales.  Abdominal:     General: Abdomen is flat. There is no distension.     Palpations: Abdomen is soft.     Tenderness: There is abdominal tenderness (TTP across suprapubic and lower abdominal areas). There is no guarding or rebound.   Neurological:     Mental Status: She is alert.   Psychiatric:        Mood and Affect: Mood normal.        Behavior: Behavior normal.        Thought Content: Thought content normal.      UC Treatments / Results  Labs (all labs ordered are listed, but only abnormal results are displayed) Labs Reviewed - No data to display  EKG   Radiology No results found.  Procedures Procedures (including critical care time)  Medications Ordered in UC Medications  ketorolac  (TORADOL ) 15 MG/ML injection 30 mg (30 mg Intramuscular Given 10/05/23 1543)    Initial Impression / Assessment and Plan / UC Course  I have reviewed  the triage vital signs and the nursing notes.  Pertinent labs & imaging results that were available during my care of the patient were reviewed by me and considered in my medical decision making (see chart for details).    Will treat with toradol  injection and advised she avoid nsaids for remainder of day. Recommended evaluation in the ED if no improvement. Low suspicion for infectious etiology or other intra-abdominal process given lack of other symptoms.   Final Clinical Impressions(s) / UC Diagnoses   Final diagnoses:  Suprapubic pain  Endometriosis determined by laparoscopy   Discharge Instructions   None    ED Prescriptions   None    PDMP not reviewed this encounter.   Billy Asberry FALCON, PA-C 10/05/23 1555

## 2024-08-10 ENCOUNTER — Ambulatory Visit: Admitting: Physician Assistant
